# Patient Record
Sex: Male | Born: 2000 | Race: White | Hispanic: No | Marital: Single | Smoking: Never smoker
Health system: Southern US, Community
[De-identification: ages and names within clinical notes are randomized; demographics above are authoritative.]

## PROBLEM LIST (undated history)

## (undated) ENCOUNTER — Ambulatory Visit: Admission: EM | Payer: Self-pay | Source: Home / Self Care

## (undated) DIAGNOSIS — K219 Gastro-esophageal reflux disease without esophagitis: Secondary | ICD-10-CM

## (undated) HISTORY — PX: FINGER SURGERY: SHX640

## (undated) HISTORY — PX: OTHER SURGICAL HISTORY: SHX169

---
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
## 2019-04-16 ENCOUNTER — Other Ambulatory Visit: Payer: Self-pay

## 2019-04-16 ENCOUNTER — Other Ambulatory Visit: Payer: Self-pay | Admitting: Otolaryngology

## 2019-04-16 ENCOUNTER — Encounter (HOSPITAL_BASED_OUTPATIENT_CLINIC_OR_DEPARTMENT_OTHER): Payer: Self-pay | Admitting: *Deleted

## 2019-04-20 ENCOUNTER — Other Ambulatory Visit (HOSPITAL_COMMUNITY)
Admission: RE | Admit: 2019-04-20 | Discharge: 2019-04-20 | Disposition: A | Discharge status: Home / Self Care | Payer: Medicaid Other | Source: Ambulatory Visit | Attending: Otolaryngology | Admitting: Otolaryngology

## 2019-04-20 DIAGNOSIS — Z1159 Encounter for screening for other viral diseases: Secondary | ICD-10-CM | POA: Diagnosis not present

## 2019-04-20 DIAGNOSIS — Z01812 Encounter for preprocedural laboratory examination: Secondary | ICD-10-CM | POA: Insufficient documentation

## 2019-04-21 LAB — NOVEL CORONAVIRUS, NAA (HOSP ORDER, SEND-OUT TO REF LAB; TAT 18-24 HRS): SARS-CoV-2, NAA: NOT DETECTED

## 2019-04-23 ENCOUNTER — Ambulatory Visit (HOSPITAL_BASED_OUTPATIENT_CLINIC_OR_DEPARTMENT_OTHER): Payer: Medicaid Other | Admitting: Anesthesiology

## 2019-04-23 ENCOUNTER — Encounter (HOSPITAL_BASED_OUTPATIENT_CLINIC_OR_DEPARTMENT_OTHER): Payer: Self-pay | Admitting: *Deleted

## 2019-04-23 ENCOUNTER — Ambulatory Visit (HOSPITAL_BASED_OUTPATIENT_CLINIC_OR_DEPARTMENT_OTHER)
Admission: RE | Admit: 2019-04-23 | Discharge: 2019-04-23 | Disposition: A | Discharge status: Home / Self Care | Payer: Medicaid Other | Attending: Otolaryngology | Admitting: Otolaryngology

## 2019-04-23 ENCOUNTER — Encounter (HOSPITAL_BASED_OUTPATIENT_CLINIC_OR_DEPARTMENT_OTHER)
Admission: RE | Disposition: A | Discharge status: Home / Self Care | Payer: Self-pay | Source: Home / Self Care | Attending: Otolaryngology

## 2019-04-23 ENCOUNTER — Other Ambulatory Visit: Payer: Self-pay

## 2019-04-23 DIAGNOSIS — J312 Chronic pharyngitis: Secondary | ICD-10-CM | POA: Diagnosis not present

## 2019-04-23 DIAGNOSIS — J353 Hypertrophy of tonsils with hypertrophy of adenoids: Secondary | ICD-10-CM | POA: Diagnosis not present

## 2019-04-23 DIAGNOSIS — G4733 Obstructive sleep apnea (adult) (pediatric): Secondary | ICD-10-CM | POA: Diagnosis not present

## 2019-04-23 DIAGNOSIS — J3503 Chronic tonsillitis and adenoiditis: Secondary | ICD-10-CM | POA: Diagnosis not present

## 2019-04-23 DIAGNOSIS — K219 Gastro-esophageal reflux disease without esophagitis: Secondary | ICD-10-CM | POA: Insufficient documentation

## 2019-04-23 DIAGNOSIS — J3501 Chronic tonsillitis: Secondary | ICD-10-CM | POA: Insufficient documentation

## 2019-04-23 HISTORY — PX: TONSILLECTOMY AND ADENOIDECTOMY: SHX28

## 2019-04-23 HISTORY — DX: Gastro-esophageal reflux disease without esophagitis: K21.9

## 2019-04-23 SURGERY — TONSILLECTOMY AND ADENOIDECTOMY
Anesthesia: General | Site: Throat | Laterality: Bilateral

## 2019-04-23 MED ORDER — MEPERIDINE HCL 25 MG/ML IJ SOLN: 6.2500 mg | INTRAMUSCULAR | Status: DC | PRN

## 2019-04-23 MED ORDER — LIDOCAINE 2% (20 MG/ML) 5 ML SYRINGE
INTRAMUSCULAR | Status: DC | PRN
  Administered 2019-04-23: 80 mg via INTRAVENOUS

## 2019-04-23 MED ORDER — SUCCINYLCHOLINE CHLORIDE 20 MG/ML IJ SOLN
INTRAMUSCULAR | Status: DC | PRN
  Administered 2019-04-23: 100 mg via INTRAVENOUS

## 2019-04-23 MED ORDER — DEXMEDETOMIDINE HCL IN NACL 400 MCG/100ML IV SOLN
INTRAVENOUS | Status: DC | PRN
  Administered 2019-04-23: 20 ug via INTRAVENOUS

## 2019-04-23 MED ORDER — SCOPOLAMINE 1 MG/3DAYS TD PT72: 1.0000 | MEDICATED_PATCH | Freq: Once | TRANSDERMAL | Status: DC | PRN

## 2019-04-23 MED ORDER — SCOPOLAMINE 1 MG/3DAYS TD PT72: 1.0000 | MEDICATED_PATCH | Freq: Once | TRANSDERMAL | Status: DC

## 2019-04-23 MED ORDER — AMOXICILLIN 400 MG/5ML PO SUSR: 800.0000 mg | Freq: Two times a day (BID) | ORAL | 0 refills | Status: AC

## 2019-04-23 MED ORDER — FENTANYL CITRATE (PF) 100 MCG/2ML IJ SOLN
INTRAMUSCULAR | Status: AC
  Filled 2019-04-23: qty 2

## 2019-04-23 MED ORDER — LACTATED RINGERS IV SOLN
INTRAVENOUS | Status: DC
  Administered 2019-04-23 (×2): via INTRAVENOUS

## 2019-04-23 MED ORDER — OXYCODONE-ACETAMINOPHEN 5-325 MG PO TABS: 1.0000 | ORAL_TABLET | ORAL | 0 refills | Status: AC | PRN

## 2019-04-23 MED ORDER — MIDAZOLAM HCL 2 MG/2ML IJ SOLN
INTRAMUSCULAR | Status: AC
  Filled 2019-04-23: qty 2

## 2019-04-23 MED ORDER — MIDAZOLAM HCL 2 MG/2ML IJ SOLN: 0.5000 mg | Freq: Once | INTRAMUSCULAR | Status: DC | PRN

## 2019-04-23 MED ORDER — HYDROMORPHONE HCL 1 MG/ML IJ SOLN
0.2500 mg | INTRAMUSCULAR | Status: DC | PRN
  Administered 2019-04-23: 0.5 mg via INTRAVENOUS

## 2019-04-23 MED ORDER — FENTANYL CITRATE (PF) 100 MCG/2ML IJ SOLN
50.0000 ug | INTRAMUSCULAR | Status: DC | PRN
  Administered 2019-04-23 (×2): 50 ug via INTRAVENOUS

## 2019-04-23 MED ORDER — HYDROMORPHONE HCL 1 MG/ML IJ SOLN
INTRAMUSCULAR | Status: AC
  Filled 2019-04-23: qty 0.5

## 2019-04-23 MED ORDER — PROPOFOL 10 MG/ML IV BOLUS
INTRAVENOUS | Status: DC | PRN
  Administered 2019-04-23: 200 mg via INTRAVENOUS

## 2019-04-23 MED ORDER — DEXAMETHASONE SODIUM PHOSPHATE 4 MG/ML IJ SOLN
INTRAMUSCULAR | Status: DC | PRN
  Administered 2019-04-23: 10 mg via INTRAVENOUS

## 2019-04-23 MED ORDER — MIDAZOLAM HCL 2 MG/2ML IJ SOLN
1.0000 mg | INTRAMUSCULAR | Status: DC | PRN
  Administered 2019-04-23: 2 mg via INTRAVENOUS

## 2019-04-23 MED ORDER — ONDANSETRON HCL 4 MG/2ML IJ SOLN
INTRAMUSCULAR | Status: DC | PRN
  Administered 2019-04-23: 4 mg via INTRAVENOUS

## 2019-04-23 MED ORDER — OXYMETAZOLINE HCL 0.05 % NA SOLN
NASAL | Status: DC | PRN
  Administered 2019-04-23: 1 via TOPICAL

## 2019-04-23 MED ORDER — SODIUM CHLORIDE 0.9 % IR SOLN
Status: DC | PRN
  Administered 2019-04-23: 500 mL

## 2019-04-23 MED ORDER — PROMETHAZINE HCL 25 MG/ML IJ SOLN: 6.2500 mg | INTRAMUSCULAR | Status: DC | PRN

## 2019-04-23 SURGICAL SUPPLY — 31 items
BNDG COHESIVE 2X5 TAN STRL LF (GAUZE/BANDAGES/DRESSINGS) IMPLANT
CANISTER SUCT 1200ML W/VALVE (MISCELLANEOUS) ×3 IMPLANT
CATH ROBINSON RED A/P 10FR (CATHETERS) IMPLANT
CATH ROBINSON RED A/P 14FR (CATHETERS) ×3 IMPLANT
COAGULATOR SUCT 6 FR SWTCH (ELECTROSURGICAL) ×1
COAGULATOR SUCT SWTCH 10FR 6 (ELECTROSURGICAL) ×2 IMPLANT
COVER BACK TABLE REUSABLE LG (DRAPES) ×3 IMPLANT
COVER MAYO STAND REUSABLE (DRAPES) ×3 IMPLANT
COVER WAND RF STERILE (DRAPES) IMPLANT
ELECT REM PT RETURN 9FT ADLT (ELECTROSURGICAL) ×3
ELECT REM PT RETURN 9FT PED (ELECTROSURGICAL)
ELECTRODE REM PT RETRN 9FT PED (ELECTROSURGICAL) IMPLANT
ELECTRODE REM PT RTRN 9FT ADLT (ELECTROSURGICAL) ×1 IMPLANT
GAUZE SPONGE 4X4 12PLY STRL LF (GAUZE/BANDAGES/DRESSINGS) ×3 IMPLANT
GLOVE BIO SURGEON STRL SZ7.5 (GLOVE) ×3 IMPLANT
GOWN STRL REUS W/ TWL LRG LVL3 (GOWN DISPOSABLE) ×2 IMPLANT
GOWN STRL REUS W/TWL LRG LVL3 (GOWN DISPOSABLE) ×4
IV NS 500ML (IV SOLUTION) ×2
IV NS 500ML BAXH (IV SOLUTION) ×1 IMPLANT
MARKER SKIN DUAL TIP RULER LAB (MISCELLANEOUS) IMPLANT
NS IRRIG 1000ML POUR BTL (IV SOLUTION) ×3 IMPLANT
SHEET MEDIUM DRAPE 40X70 STRL (DRAPES) ×3 IMPLANT
SOLUTION BUTLER CLEAR DIP (MISCELLANEOUS) ×3 IMPLANT
SPONGE TONSIL TAPE 1.25 RFD (DISPOSABLE) ×3 IMPLANT
SYR BULB 3OZ (MISCELLANEOUS) IMPLANT
TOWEL GREEN STERILE FF (TOWEL DISPOSABLE) ×3 IMPLANT
TUBE CONNECTING 20'X1/4 (TUBING) ×1
TUBE CONNECTING 20X1/4 (TUBING) ×2 IMPLANT
TUBE SALEM SUMP 12R W/ARV (TUBING) IMPLANT
TUBE SALEM SUMP 16 FR W/ARV (TUBING) ×3 IMPLANT
WAND COBLATOR 70 EVAC XTRA (SURGICAL WAND) ×3 IMPLANT

## 2019-04-23 NOTE — H&P (Signed)
Cc: Recurrent throat swelling, sore throat, loud snoring  HPI: The patient is an 18 year old male who presents today complaining of recurrent throat swelling, hoarseness, and sore throat.  According to the patient, he has noted recurrent swelling of his throat over the past year.  One month ago, he had a severe episode, requiring a visit to the [facility]  He was treated with IV and topical antibiotics.  The swelling is mostly at the back of his throat.  He has a history of recurrent strep infections.  He has had 5 episodes over the past year, requiring treatment with multiple antibiotics.  According to his girlfriend, the patient also snores loudly at night.  She has witnessed several apnea episodes.  The patient has no previous history of ENT surgery.  He has occasional gastroesophageal reflux symptoms.  He is not on any medication at this time.  Currently he denies any significant dysphagia, odynophagia, or dyspnea.   The patient's review of systems (constitutional, eyes, ENT, cardiovascular, respiratory, GI, musculoskeletal, skin, neurologic, psychiatric, endocrine, hematologic, allergic) is noted in the ROS questionnaire.  It is reviewed with the patient.   Family health history: No HTN, DM, CAD, hearing loss or bleeding disorder.  Major events: None.  Ongoing medical problems: Chest pain, reflux, headaches.  Social history: The patient is single. He denies the use tobacco, alcohol or illegal drugs.   Exam: General: Appears normal, non-syndromic, in no acute distress. Head: Normocephalic, no evidence injury, no tenderness, facial buttresses intact without stepoff. Face/sinus: No tenderness to palpation and percussion. Facial movement is normal and symmetric. Eyes: PERRL, EOMI. No scleral icterus, conjunctivae clear. Neuro: CN II exam reveals vision grossly intact.  No nystagmus at any point of gaze. Ears: Auricles well formed without lesions.  Ear canals are intact without mass or lesion.  No  erythema or edema is appreciated.  The TMs are intact without fluid. Nose: External evaluation reveals normal support and skin without lesions.  Dorsum is intact.  Anterior rhinoscopy reveals pink mucosa over anterior aspect of inferior turbinates and intact septum.  No purulence noted. Oral:  Oral cavity and oropharynx are intact, symmetric, without erythema or edema.  Mucosa is moist without lesions. 3+ cryptic and erythematous tonsils bilaterally. Neck: Full range of motion without pain.  There is no significant lymphadenopathy.  No masses palpable.  Thyroid bed within normal limits to palpation.  Parotid glands and submandibular glands equal bilaterally without mass.  Trachea is midline. Neuro:  CN 2-12 grossly intact. Gait normal.   Procedure:  Flexible Fiberoptic Laryngoscopy Risks, benefits, and alternatives of flexible endoscopy were explained to the patient.  Specific mention was made of the risk of throat numbness with difficulty swallowing, possible bleeding from the nose and mouth, and pain from the procedure.  The patient gave oral consent to proceed.  The nasal cavities were decongested and anesthetised with a combination of oxymetazoline and 4% lidocaine solution.  The flexible scope was inserted into the right nasal cavity and advanced towards the nasopharynx.  Visualized mucosa over the turbinates and septum were as described above.  The nasopharynx has significant adenoid hypertrophy. Oropharyngeal walls were symmetric and mobile without lesion, mass, or edema.  Hypopharynx was also without lesion or edema.  Larynx was mobile without lesions. Supraglottic structures were free of edema, mass, and asymmetry.  True vocal folds were white without mass or lesion.  Base of tongue was within normal limits.   Assessment 1.  The patient's history is consistent with  chronic tonsillitis/pharyngitis and obstructive sleep disorder, secondary to severe adenotonsillar hypertrophy.  3+ tonsils are noted  bilaterally.  Severe adenoid hypertrophy is also noted on today's endoscopy examination.  2.  The patient otherwise has a normal laryngoscopy examination.  His larynx and his glottic opening are widely patent.   Plan  1.  The physical exam and endoscopy findings are reviewed with the patient.  2.  Based on the above findings, the patient is a candidate to undergo the adenotonsillectomy procedure.  The risks, benefits, alternatives and details of the procedure are reviewed with the patient.  Questions are invited and answered.  3.  The patient would like to proceed with the procedure.

## 2019-04-23 NOTE — Op Note (Signed)
DATE OF PROCEDURE:  04/23/2019                              OPERATIVE REPORT  SURGEON:  Leta Baptist, MD  PREOPERATIVE DIAGNOSES: 1. Adenotonsillar hypertrophy. 2. Chronic tonsillitis and pharyngitis  POSTOPERATIVE DIAGNOSES: 1. Adenotonsillar hypertrophy. 2. Chronic tonsillitis and pharyngitis  PROCEDURE PERFORMED:  Adenotonsillectomy.  ANESTHESIA:  General endotracheal tube anesthesia.  COMPLICATIONS:  None.  ESTIMATED BLOOD LOSS:  Minimal.  INDICATION FOR PROCEDURE:  Billy Conway Patricia Nettle is a 18 y.o. male with a history of chronic tonsillitis/pharyngitis and obstructive sleep disorder symptoms.  According to the patient, he has been experiencing chronic throat discomfort with loud snoring for several years. The patient continues to be symptomatic despite medical treatments. On examination, the patient was noted to have bilateral 3+ cryptic tonsils, with numerous tonsilloliths. Based on the above findings, the decision was made for the patient to undergo the adenotonsillectomy procedure. Likelihood of success in reducing symptoms was also discussed.  The risks, benefits, alternatives, and details of the procedure were discussed with the patient.  Questions were invited and answered.  Informed consent was obtained.  DESCRIPTION:  The patient was taken to the operating room and placed supine on the operating table.  General endotracheal tube anesthesia was administered by the anesthesiologist.  The patient was positioned and prepped and draped in a standard fashion for adenotonsillectomy.  A Crowe-Davis mouth gag was inserted into the oral cavity for exposure. 3+ cryptic tonsils were noted bilaterally.  No bifidity was noted.  Indirect mirror examination of the nasopharynx revealed mild adenoid hypertrophy. The adenoid was ablated with the Coblator device. Hemostasis was achieved with the Coblator device.  The right tonsil was then grasped with a straight Allis clamp and retracted medially.   It was resected free from the underlying pharyngeal constrictor muscles with the Coblator device.  The same procedure was repeated on the left side without exception.  The surgical sites were copiously irrigated.  The mouth gag was removed.  The care of the patient was turned over to the anesthesiologist.  The patient was awakened from anesthesia without difficulty.  The patient was extubated and transferred to the recovery room in good condition.  OPERATIVE FINDINGS:  Adenotonsillar hypertrophy.  SPECIMEN:  None.  FOLLOWUP CARE:  The patient will be discharged home once awake and alert.  He will be placed on amoxicillin 800 mg p.o. b.i.d. for 5 days, and percocet for postop pain control.   The patient will follow up in my office in approximately 2 weeks.  Dustine Stickler W Sevannah Madia 04/23/2019 10:12 AM

## 2019-04-23 NOTE — Anesthesia Procedure Notes (Signed)
Procedure Name: Intubation Date/Time: 04/23/2019 9:37 AM Performed by: Eulas Post, Bretta Fees W, CRNA Pre-anesthesia Checklist: Patient identified, Emergency Drugs available, Suction available and Patient being monitored Patient Re-evaluated:Patient Re-evaluated prior to induction Oxygen Delivery Method: Circle system utilized Preoxygenation: Pre-oxygenation with 100% oxygen Induction Type: IV induction Ventilation: Mask ventilation without difficulty Laryngoscope Size: Miller and 2 Grade View: Grade I Tube type: Oral Tube size: 7.0 mm Number of attempts: 1 Airway Equipment and Method: Stylet and Oral airway Placement Confirmation: ETT inserted through vocal cords under direct vision,  positive ETCO2 and breath sounds checked- equal and bilateral Secured at: 24 cm Tube secured with: Tape Dental Injury: Teeth and Oropharynx as per pre-operative assessment

## 2019-04-23 NOTE — Discharge Instructions (Signed)
Billy Conway WOOI Olin Gurski M.D[facility] P.A[facility] °Postoperative Instructions for Tonsillectomy & Adenoidectomy (T&A) °Activity °Restrict activity at home for the first two days, resting as much as possible. Light indoor activity is best. You may usually return to school or work within a week but void strenuous activity and sports for two weeks. Sleep with your head elevated on 2-3 pillows for 3-4 days to help decrease swelling. °Diet °Due to tissue swelling and throat discomfort, you may have little desire to drink for several days. However fluids are very important to prevent dehydration. You will find that non-acidic juices, soups, popsicles, Jell-O, custard, puddings, and any soft or mashed foods taken in small quantities can be swallowed fairly easily. Try to increase your fluid and food intake as the discomfort subsides. It is recommended that a child receive 1-1/2 quarts of fluid in a 24-hour period. Adult require twice this amount.  °Discomfort °Your sore throat may be relieved by applying an ice collar to your neck and/or by taking Tylenol®. You may experience an earache, which is due to referred pain from the throat. Referred ear pain is commonly felt at night when trying to rest. ° °Bleeding                        Although rare, there is risk of having some bleeding during the first 2 weeks after having a T&A. This usually happens between days 7-10 postoperatively. If you or your child should have any bleeding, try to remain calm. We recommend sitting up quietly in a chair and gently spitting out the blood into a bowl. For adults, gargling gently with ice water may help. If the bleeding does not stop after a short time (5 minutes), is more than 1 teaspoonful, or if you become worried, please call our office at [phone] or go directly to the nearest hospital emergency room. Do not eat or drink anything prior to going to the hospital as you may need to be taken to the operating room in order to control the bleeding. °GENERAL  CONSIDERATIONS °1. Brush your teeth regularly. Avoid mouthwashes and gargles for three weeks. You may gargle gently with warm salt-water as necessary or spray with Chloraseptic®. You may make salt-water by placing 2 teaspoons of table salt into a quart of fresh water. Warm the salt-water in a microwave to a luke warm temperature.  °2. Avoid exposure to colds and upper respiratory infections if possible.  °3. If you look into a mirror or into your child's mouth, you will see white-gray patches in the back of the throat. This is normal after having a T&A and is like a scab that forms on the skin after an abrasion. It will disappear once the back of the throat heals completely. However, it may cause a noticeable odor; this too will disappear with time. Again, warm salt-water gargles may be used to help keep the throat clean and promote healing.  °4. You may notice a temporary change in voice quality, such as a higher pitched voice or a nasal sound, until healing is complete. This may last for 1-2 weeks and should resolve.  °5. Do not take or give you child any medications that we have not prescribed or recommended.  °6. Snoring may occur, especially at night, for the first week after a T&A. It is due to swelling of the soft palate and will usually resolve.  °Please call our office at [phone] if you have any questions.   °

## 2019-04-23 NOTE — Anesthesia Preprocedure Evaluation (Addendum)
Anesthesia Evaluation  Patient identified by MRN, date of birth, ID band Patient awake    Reviewed: Allergy & Precautions, NPO status , Patient's Chart, lab work & pertinent test results  History of Anesthesia Complications Negative for: history of anesthetic complications  Airway Mallampati: I  TM Distance: >3 FB Neck ROM: Full    Dental  (+) Dental Advisory Given   Pulmonary neg pulmonary ROS,  04/20/2019 novel coronavirus NEG   breath sounds clear to auscultation       Cardiovascular negative cardio ROS   Rhythm:Regular Rate:Normal     Neuro/Psych negative neurological ROS     GI/Hepatic Neg liver ROS, GERD  Controlled,  Endo/Other  negative endocrine ROS  Renal/GU negative Renal ROS     Musculoskeletal   Abdominal   Peds  Hematology negative hematology ROS (+)   Anesthesia Other Findings   Reproductive/Obstetrics                            Anesthesia Physical Anesthesia Plan  ASA: II  Anesthesia Plan: General   Post-op Pain Management:    Induction: Intravenous  PONV Risk Score and Plan: 3 and Scopolamine patch - Pre-op, Ondansetron and Dexamethasone  Airway Management Planned: Oral ETT  Additional Equipment:   Intra-op Plan:   Post-operative Plan: Extubation in OR  Informed Consent: I have reviewed the patients History and Physical, chart, labs and discussed the procedure including the risks, benefits and alternatives for the proposed anesthesia with the patient or authorized representative who has indicated his/her understanding and acceptance.     Dental advisory given  Plan Discussed with: CRNA and Surgeon  Anesthesia Plan Comments:         Anesthesia Quick Evaluation

## 2019-04-23 NOTE — Transfer of Care (Signed)
Immediate Anesthesia Transfer of Care Note  Patient: Billy Conway  Procedure(s) Performed: TONSILLECTOMY AND ADENOIDECTOMY (Bilateral Throat)  Patient Location: PACU  Anesthesia Type:General  Level of Consciousness: drowsy  Airway & Oxygen Therapy: Patient Spontanous Breathing and Patient connected to face mask oxygen  Post-op Assessment: Report given to RN and Post -op Vital signs reviewed and stable  Post vital signs: Reviewed and stable  Last Vitals:  Vitals Value Taken Time  BP 120/90 04/23/19 1015  Temp    Pulse 63 04/23/19 1016  Resp 9 04/23/19 1016  SpO2 100 % 04/23/19 1016  Vitals shown include unvalidated device data.  Last Pain:  Vitals:   04/23/19 0833  TempSrc: Oral  PainSc: 0-No pain      Patients Stated Pain Goal: 0 (34/91/79 1505)  Complications: No apparent anesthesia complications

## 2019-04-23 NOTE — Anesthesia Postprocedure Evaluation (Signed)
Anesthesia Post Note  Patient: Billy Conway  Procedure(s) Performed: TONSILLECTOMY AND ADENOIDECTOMY (Bilateral Throat)     Patient location during evaluation: PACU Anesthesia Type: General Level of consciousness: awake and alert, oriented and patient cooperative Pain management: pain level controlled Vital Signs Assessment: post-procedure vital signs reviewed and stable Respiratory status: spontaneous breathing, nonlabored ventilation and respiratory function stable Cardiovascular status: blood pressure returned to baseline and stable Postop Assessment: no apparent nausea or vomiting Anesthetic complications: no    Last Vitals:  Vitals:   04/23/19 1130 04/23/19 1155  BP: 116/70 121/84  Pulse: (!) 53 62  Resp: 16 18  Temp:  36.4 C  SpO2: 100% 100%    Last Pain:  Vitals:   04/23/19 1155  TempSrc:   PainSc: 4                  Adelee Hannula,E. Demetrius Mahler

## 2019-04-26 ENCOUNTER — Encounter (HOSPITAL_BASED_OUTPATIENT_CLINIC_OR_DEPARTMENT_OTHER): Payer: Self-pay | Admitting: Otolaryngology

## 2020-07-12 ENCOUNTER — Ambulatory Visit
Admission: EM | Admit: 2020-07-12 | Discharge: 2020-07-12 | Disposition: A | Discharge status: Home / Self Care | Payer: Medicaid Other | Attending: Emergency Medicine | Admitting: Emergency Medicine

## 2020-07-12 ENCOUNTER — Other Ambulatory Visit: Payer: Self-pay

## 2020-07-12 DIAGNOSIS — Z1152 Encounter for screening for COVID-19: Secondary | ICD-10-CM | POA: Diagnosis not present

## 2020-07-12 DIAGNOSIS — R0981 Nasal congestion: Secondary | ICD-10-CM | POA: Diagnosis not present

## 2020-07-12 DIAGNOSIS — J029 Acute pharyngitis, unspecified: Secondary | ICD-10-CM | POA: Diagnosis not present

## 2020-07-12 DIAGNOSIS — Z23 Encounter for immunization: Secondary | ICD-10-CM

## 2020-07-12 MED ORDER — FLUTICASONE PROPIONATE 50 MCG/ACT NA SUSP: 1.0000 | Freq: Every day | NASAL | 0 refills | Status: DC

## 2020-07-12 MED ORDER — CETIRIZINE HCL 10 MG PO TABS: 10.0000 mg | ORAL_TABLET | Freq: Every day | ORAL | 0 refills | Status: DC

## 2020-07-12 NOTE — ED Provider Notes (Signed)
[facility]    CSN: [id number] Arrival date & time: 07/12/20  1918      History   Chief Complaint Chief Complaint  Patient presents with  . Nasal Congestion    HPI Billy Conway is a 19 y.o. male  Presenting for 5-day course of nasal congestion, scratchy throat.  No known sick contacts, fever, chest pain, difficulty breathing.  Denying cough.  Patient did recently return to school, does work.  Has not tried thing for symptoms.  Past Medical History:  Diagnosis Date  . GERD (gastroesophageal reflux disease)     There are no problems to display for this patient.   Past Surgical History:  Procedure Laterality Date  . FINGER SURGERY    . TONSILLECTOMY AND ADENOIDECTOMY Bilateral 04/23/2019   Procedure: TONSILLECTOMY AND ADENOIDECTOMY;  Surgeon: Newman Pies, MD;  Location: [facility];  Service: ENT;  Laterality: Bilateral;  . Wisdom Teeth Extractions         Home Medications    Prior to Admission medications   Medication Sig Start Date End Date Taking? Authorizing Provider  acetaminophen (TYLENOL) 500 MG tablet Take 500 mg by mouth every 6 (six) hours as needed (pain/swelling).    [provider]  cetirizine (ZYRTEC ALLERGY) 10 MG tablet Take 1 tablet (10 mg total) by mouth daily. 07/12/20   Hall-Potvin, [place], PA-C  fluticasone (FLONASE) 50 MCG/ACT nasal spray Place 1 spray into both nostrils daily. 07/12/20   Hall-Potvin, [place], PA-C  ibuprofen (ADVIL,MOTRIN) 600 MG tablet Take 1 tablet (600 mg total) by mouth every 6 (six) hours as needed for fever, mild pain or moderate pain. 06/23/16   Lowanda Foster, NP    Family History History reviewed. No pertinent family history.  Social History Social History   Tobacco Use  . Smoking status: Never Smoker  . Smokeless tobacco: Never Used  Substance Use Topics  . Alcohol use: Not Currently  . Drug use: Not Currently    Types: Marijuana    Comment: marijuana - last smoked  6 months ago     Allergies   Patient has no known allergies.   Review of Systems As per HPI   Physical Exam Triage Vital Signs ED Triage Vitals  Enc Vitals Group     BP      Pulse      Resp      Temp      Temp src      SpO2      Weight      Height      Head Circumference      Peak Flow      Pain Score      Pain Loc      Pain Edu?      Excl. in GC?    No data found.  Updated Vital Signs BP 133/73 (BP Location: Left Arm)   Pulse 66   Temp (!) 97.3 F (36.3 C) (Tympanic)   Resp 17   Wt 168 lb 2 oz (76.3 kg)   SpO2 99%   BMI 21.59 kg/m   Visual Acuity Right Eye Distance:   Left Eye Distance:   Bilateral Distance:    Right Eye Near:   Left Eye Near:    Bilateral Near:     Physical Exam Constitutional:      General: He is not in acute distress.    Appearance: He is not toxic-appearing or diaphoretic.  HENT:     Head: Normocephalic  and atraumatic.     Mouth/Throat:     Mouth: Mucous membranes are moist.     Pharynx: Oropharynx is clear.  Eyes:     General: No scleral icterus.    Conjunctiva/sclera: Conjunctivae normal.     Pupils: Pupils are equal, round, and reactive to light.  Neck:     Comments: Trachea midline, negative JVD Cardiovascular:     Rate and Rhythm: Normal rate and regular rhythm.  Pulmonary:     Effort: Pulmonary effort is normal. No respiratory distress.     Breath sounds: No wheezing.  Musculoskeletal:     Cervical back: Neck supple. No tenderness.  Lymphadenopathy:     Cervical: No cervical adenopathy.  Skin:    Capillary Refill: Capillary refill takes less than 2 seconds.     Coloration: Skin is not jaundiced or pale.     Findings: No rash.  Neurological:     Mental Status: He is alert and oriented to person, place, and time.      UC Treatments / Results  Labs (all labs ordered are listed, but only abnormal results are displayed) Labs Reviewed  NOVEL CORONAVIRUS, NAA    EKG   Radiology No results  found.  Procedures Procedures (including critical care time)  Medications Ordered in UC Medications - No data to display  Initial Impression / Assessment and Plan / UC Course  I have reviewed the triage vital signs and the nursing notes.  Pertinent labs & imaging results that were available during my care of the patient were reviewed by me and considered in my medical decision making (see chart for details).     Patient afebrile, nontoxic, with SpO2 99%.  Centor criteria does not qualify patient for strep testing at this time.  Covid PCR pending.  Patient to quarantine until results are back.  We will treat supportively as outlined below.  Return precautions discussed, patient verbalized understanding and is agreeable to plan. Final Clinical Impressions(s) / UC Diagnoses   Final diagnoses:  Encounter for screening for COVID-19  Nasal congestion  Sore throat     Discharge Instructions     Your COVID test is pending - it is important to quarantine / isolate at home until your results are back. If you test positive and would like further evaluation for persistent or worsening symptoms, you may schedule an E-visit or virtual (video) visit throughout the [facility] app or website.  PLEASE NOTE: If you develop severe chest pain or shortness of breath please go to the ER or call 9-1-1 for further evaluation --> DO NOT schedule electronic or virtual visits for this. Please call our office for further guidance / recommendations as needed.  For information about the Covid vaccine, please visit [email or web address]    ED Prescriptions    Medication Sig Dispense Auth. Provider   cetirizine (ZYRTEC ALLERGY) 10 MG tablet Take 1 tablet (10 mg total) by mouth daily. 30 tablet Hall-Potvin, [place], PA-C   fluticasone (FLONASE) 50 MCG/ACT nasal spray Place 1 spray into both nostrils daily. 16 g Hall-Potvin, [place], PA-C     PDMP not reviewed this encounter.   Hall-Potvin,  [place], [place] 07/12/20 2022

## 2020-07-12 NOTE — Discharge Instructions (Addendum)
Your COVID test is pending - it is important to quarantine / isolate at home until your results are back. °If you test positive and would like further evaluation for persistent or worsening symptoms, you may schedule an E-visit or virtual (video) visit throughout the [facility] MyChart app or website. ° °PLEASE NOTE: If you develop severe chest pain or shortness of breath please go to the ER or call 9-1-1 for further evaluation --> DO NOT schedule electronic or virtual visits for this. °Please call our office for further guidance / recommendations as needed. ° °For information about the Covid vaccine, please visit [email or web address] °

## 2020-07-12 NOTE — ED Triage Notes (Signed)
Pt c/o nasal congestion with a dry throat x 4-5 days.

## 2020-07-14 ENCOUNTER — Telehealth: Payer: Self-pay | Admitting: Physician Assistant

## 2020-07-14 NOTE — Telephone Encounter (Signed)
Mother states insurance would not cover flonase unless Rx written for <34 doses. Attempted to adjust to <34 doses, but unable to do so. Discussed medicine is over the counter, mother states will pick up over the counter to use.

## 2020-07-15 LAB — NOVEL CORONAVIRUS, NAA: SARS-CoV-2, NAA: NOT DETECTED

## 2020-09-12 ENCOUNTER — Encounter: Payer: Self-pay | Admitting: Emergency Medicine

## 2020-09-12 ENCOUNTER — Ambulatory Visit
Admission: EM | Admit: 2020-09-12 | Discharge: 2020-09-12 | Disposition: A | Discharge status: Home / Self Care | Payer: Medicaid Other | Attending: Emergency Medicine | Admitting: Emergency Medicine

## 2020-09-12 DIAGNOSIS — J069 Acute upper respiratory infection, unspecified: Secondary | ICD-10-CM | POA: Diagnosis not present

## 2020-09-12 DIAGNOSIS — Z1152 Encounter for screening for COVID-19: Secondary | ICD-10-CM

## 2020-09-12 MED ORDER — ACETAMINOPHEN 325 MG PO TABS
650.0000 mg | ORAL_TABLET | Freq: Once | ORAL | Status: AC
  Administered 2020-09-12: 650 mg via ORAL

## 2020-09-12 NOTE — ED Triage Notes (Signed)
Pt presents with headache, cough, and body aches xs 2 days.

## 2020-09-12 NOTE — ED Provider Notes (Signed)
[facility]    CSN: [id number] Arrival date & time: 09/12/20  1917      History   Chief Complaint Chief Complaint  Patient presents with  . Headache  . Cough  . Generalized Body Aches    HPI Billy Conway Valarie Merino is a 19 y.o. male  Presenting for Covid testing: Exposure: girlfriend's niece Date of exposure: 10/31 Any fever, symptoms since exposure: Yes-mild, intermittent headache, nonproductive cough, myalgias x2 days.  No chest pain, shortness of breath, fever.  Has not taken anything for symptoms.  Past Medical History:  Diagnosis Date  . GERD (gastroesophageal reflux disease)     There are no problems to display for this patient.   Past Surgical History:  Procedure Laterality Date  . FINGER SURGERY    . TONSILLECTOMY AND ADENOIDECTOMY Bilateral 04/23/2019   Procedure: TONSILLECTOMY AND ADENOIDECTOMY;  Surgeon: Newman Pies, MD;  Location: [facility];  Service: ENT;  Laterality: Bilateral;  . Wisdom Teeth Extractions         Home Medications    Prior to Admission medications   Medication Sig Start Date End Date Taking? Authorizing Provider  acetaminophen (TYLENOL) 500 MG tablet Take 500 mg by mouth every 6 (six) hours as needed (pain/swelling).    [provider]  cetirizine (ZYRTEC ALLERGY) 10 MG tablet Take 1 tablet (10 mg total) by mouth daily. 07/12/20   Hall-Potvin, [place], PA-C  fluticasone (FLONASE) 50 MCG/ACT nasal spray Place 1 spray into both nostrils daily. 07/12/20   Hall-Potvin, [place], PA-C  ibuprofen (ADVIL,MOTRIN) 600 MG tablet Take 1 tablet (600 mg total) by mouth every 6 (six) hours as needed for fever, mild pain or moderate pain. 06/23/16   Lowanda Foster, NP    Family History History reviewed. No pertinent family history.  Social History Social History   Tobacco Use  . Smoking status: Never Smoker  . Smokeless tobacco: Never Used  Substance Use Topics  . Alcohol use: Not Currently  . Drug use:  Not Currently    Types: Marijuana    Comment: marijuana - last smoked 6 months ago     Allergies   Patient has no known allergies.   Review of Systems Review of Systems  Constitutional: Negative for fatigue and fever.  Respiratory: Positive for cough. Negative for shortness of breath.   Cardiovascular: Negative for chest pain and palpitations.  Gastrointestinal: Negative for abdominal pain, diarrhea and vomiting.  Musculoskeletal: Positive for myalgias. Negative for arthralgias.  Skin: Negative for rash and wound.  Neurological: Positive for headaches. Negative for speech difficulty.  All other systems reviewed and are negative.    Physical Exam Triage Vital Signs ED Triage Vitals  Enc Vitals Group     BP 09/12/20 1927 (!) 141/82     Pulse Rate 09/12/20 1927 84     Resp 09/12/20 1927 17     Temp 09/12/20 1927 (!) 100.9 F (38.3 C)     Temp Source 09/12/20 1927 Oral     SpO2 09/12/20 1927 98 %     Weight --      Height --      Head Circumference --      Peak Flow --      Pain Score 09/12/20 1926 6     Pain Loc --      Pain Edu? --      Excl. in GC? --    No data found.  Updated Vital Signs BP (!) 141/82 (BP Location: Left  Arm)   Pulse 84   Temp (!) 100.9 F (38.3 C) (Oral)   Resp 17   SpO2 98%   Visual Acuity Right Eye Distance:   Left Eye Distance:   Bilateral Distance:    Right Eye Near:   Left Eye Near:    Bilateral Near:     Physical Exam Constitutional:      General: He is not in acute distress.    Appearance: He is not toxic-appearing or diaphoretic.  HENT:     Head: Normocephalic and atraumatic.     Mouth/Throat:     Mouth: Mucous membranes are moist.     Pharynx: Oropharynx is clear.  Eyes:     General: No scleral icterus.    Conjunctiva/sclera: Conjunctivae normal.     Pupils: Pupils are equal, round, and reactive to light.  Neck:     Comments: Trachea midline, negative JVD Cardiovascular:     Rate and Rhythm: Normal rate and  regular rhythm.  Pulmonary:     Effort: Pulmonary effort is normal. No respiratory distress.     Breath sounds: No wheezing.  Musculoskeletal:     Cervical back: Neck supple. No tenderness.  Lymphadenopathy:     Cervical: No cervical adenopathy.  Skin:    Capillary Refill: Capillary refill takes less than 2 seconds.     Coloration: Skin is not jaundiced or pale.     Findings: No rash.  Neurological:     Mental Status: He is alert and oriented to person, place, and time.      UC Treatments / Results  Labs (all labs ordered are listed, but only abnormal results are displayed) Labs Reviewed  NOVEL CORONAVIRUS, NAA    EKG   Radiology No results found.  Procedures Procedures (including critical care time)  Medications Ordered in UC Medications  acetaminophen (TYLENOL) tablet 650 mg (650 mg Oral Given 09/12/20 1935)    Initial Impression / Assessment and Plan / UC Course  I have reviewed the triage vital signs and the nursing notes.  Pertinent labs & imaging results that were available during my care of the patient were reviewed by me and considered in my medical decision making (see chart for details).     Patient febrile, nontoxic, with SpO2 98%.  Covid PCR pending.  Patient to quarantine until results are back.  We will treat supportively as outlined below.  Return precautions discussed, patient verbalized understanding and is agreeable to plan. Final Clinical Impressions(s) / UC Diagnoses   Final diagnoses:  Encounter for screening for COVID-19  URI with cough and congestion     Discharge Instructions     Zyrtec, Start flonase, atrovent nasal spray for nasal congestion/drainage. You can use over the counter nasal saline rinse such as neti pot for nasal congestion. Keep hydrated, your urine should be clear to pale yellow in color. Tylenol/motrin for fever and pain. Monitor for any worsening of symptoms, chest pain, shortness of breath, wheezing, swelling of the  throat, go to the emergency department for further evaluation needed.     ED Prescriptions    None     PDMP not reviewed this encounter.   Hall-Potvin, [place], [place] 09/12/20 2017

## 2020-09-12 NOTE — Discharge Instructions (Signed)
Zyrtec, Start flonase, atrovent nasal spray for nasal congestion/drainage. You can use over the counter nasal saline rinse such as neti pot for nasal congestion. Keep hydrated, your urine should be clear to pale yellow in color. Tylenol/motrin for fever and pain. Monitor for any worsening of symptoms, chest pain, shortness of breath, wheezing, swelling of the throat, go to the emergency department for further evaluation needed.

## 2020-09-14 ENCOUNTER — Telehealth: Payer: Self-pay | Admitting: Emergency Medicine

## 2020-09-14 LAB — NOVEL CORONAVIRUS, NAA

## 2020-09-14 NOTE — Telephone Encounter (Signed)
Received call from [facility] that this patient's COVID sample did not have a label on it and it will need to be recollected.  Attempted to reach patient x 1, LVM.  Updated staff at facility in case patient returns today.

## 2021-04-11 ENCOUNTER — Ambulatory Visit: Admission: EM | Admit: 2021-04-11 | Discharge: 2021-04-11 | Discharge status: Against Medical Advice | Payer: 59

## 2021-04-11 ENCOUNTER — Other Ambulatory Visit: Payer: Self-pay

## 2021-04-12 ENCOUNTER — Other Ambulatory Visit: Payer: Self-pay

## 2021-04-12 ENCOUNTER — Ambulatory Visit
Admission: RE | Admit: 2021-04-12 | Discharge: 2021-04-12 | Disposition: A | Discharge status: Home / Self Care | Payer: Self-pay | Source: Ambulatory Visit

## 2021-04-12 ENCOUNTER — Ambulatory Visit
Admission: EM | Admit: 2021-04-12 | Discharge: 2021-04-12 | Disposition: A | Discharge status: Home / Self Care | Payer: Medicaid Other | Attending: Emergency Medicine | Admitting: Emergency Medicine

## 2021-04-12 ENCOUNTER — Encounter: Payer: Self-pay | Admitting: Emergency Medicine

## 2021-04-12 DIAGNOSIS — H5789 Other specified disorders of eye and adnexa: Secondary | ICD-10-CM

## 2021-04-12 MED ORDER — POLYMYXIN B-TRIMETHOPRIM 10000-0.1 UNIT/ML-% OP SOLN: 1.0000 [drp] | Freq: Four times a day (QID) | OPHTHALMIC | 0 refills | Status: AC

## 2021-04-12 MED ORDER — FLUTICASONE PROPIONATE 50 MCG/ACT NA SUSP: 1.0000 | Freq: Every day | NASAL | 0 refills | Status: DC

## 2021-04-12 NOTE — ED Provider Notes (Signed)
[facility]    CSN: [id number] Arrival date & time: 04/12/21  1208      History   Chief Complaint Chief Complaint  Patient presents with  . Eye Problem    HPI Billy Conway Billy Conway is a 20 y.o. male history of GERD presenting today for evaluation of left eye redness.  Reports over the past day or 2 he has noticed increased redness to his left eye.  Denies any significant pain or itching.  Mainly noticed redness when looking in the mirror.  Felt slight irritation that he thought may be a stye.  Denies any drainage.  Denies change in vision.  Denies use of contacts.  Has had some mild associated congestion over the past week.  HPI  Past Medical History:  Diagnosis Date  . GERD (gastroesophageal reflux disease)     There are no problems to display for this patient.   Past Surgical History:  Procedure Laterality Date  . FINGER SURGERY    . TONSILLECTOMY AND ADENOIDECTOMY Bilateral 04/23/2019   Procedure: TONSILLECTOMY AND ADENOIDECTOMY;  Surgeon: Newman Pies, MD;  Location: [facility];  Service: ENT;  Laterality: Bilateral;  . Wisdom Teeth Extractions         Home Medications    Prior to Admission medications   Medication Sig Start Date End Date Taking? Authorizing Provider  fluticasone (FLONASE) 50 MCG/ACT nasal spray Place 1-2 sprays into both nostrils daily. 04/12/21  Yes Teola Felipe C, PA-C  trimethoprim-polymyxin b (POLYTRIM) ophthalmic solution Place 1 drop into the left eye every 6 (six) hours for 7 days. 04/12/21 04/19/21 Yes Kian Ottaviano C, PA-C  cetirizine (ZYRTEC ALLERGY) 10 MG tablet Take 1 tablet (10 mg total) by mouth daily. 07/12/20 04/12/21  Hall-Potvin, [place], PA-C    Family History No family history on file.  Social History Social History   Tobacco Use  . Smoking status: Never Smoker  . Smokeless tobacco: Never Used  Substance Use Topics  . Alcohol use: Not Currently  . Drug use: Not Currently    Types: Marijuana     Comment: marijuana - last smoked 6 months ago     Allergies   Patient has no known allergies.   Review of Systems Review of Systems  Constitutional: Negative for activity change, appetite change, chills, fatigue and fever.  HENT: Positive for congestion and rhinorrhea. Negative for ear pain, sinus pressure, sore throat and trouble swallowing.   Eyes: Positive for redness. Negative for photophobia, pain, discharge and visual disturbance.  Respiratory: Negative for cough, chest tightness and shortness of breath.   Cardiovascular: Negative for chest pain.  Gastrointestinal: Negative for abdominal pain, diarrhea, nausea and vomiting.  Musculoskeletal: Negative for myalgias.  Skin: Negative for rash.  Neurological: Negative for dizziness, light-headedness and headaches.     Physical Exam Triage Vital Signs ED Triage Vitals  Enc Vitals Group     BP 04/12/21 1218 (!) 156/89     Pulse Rate 04/12/21 1218 78     Resp --      Temp 04/12/21 1218 98.1 F (36.7 C)     Temp Source 04/12/21 1218 Oral     SpO2 04/12/21 1218 97 %     Weight --      Height --      Head Circumference --      Peak Flow --      Pain Score 04/12/21 1219 0     Pain Loc --      Pain  Edu? --      Excl. in GC? --    No data found.  Updated Vital Signs BP (!) 156/89 (BP Location: Left Arm)   Pulse 78   Temp 98.1 F (36.7 C) (Oral)   SpO2 97%   Visual Acuity Right Eye Distance: 20 15 Left Eye Distance: 20 25 Bilateral Distance: 20 15  Right Eye Near:   Left Eye Near:    Bilateral Near:     Physical Exam Vitals and nursing note reviewed.  Constitutional:      Appearance: He is well-developed.     Comments: No acute distress  HENT:     Head: Normocephalic and atraumatic.     Ears:     Comments: Bilateral ears without tenderness to palpation of external auricle, tragus and mastoid, EAC's without erythema or swelling, TM's with good bony landmarks and cone of light. Non erythematous.      Nose: Nose normal.     Mouth/Throat:     Comments: Oral mucosa pink and moist, no tonsillar enlargement or exudate. Posterior pharynx patent and nonerythematous, no uvula deviation or swelling. Normal phonation. Eyes:     Conjunctiva/sclera: Conjunctivae normal.     Comments: Left eye with increased erythema noted to inferior lateral portion, vasculature appears more injected, no photophobia with exam, anterior chamber clear  Cardiovascular:     Rate and Rhythm: Normal rate.  Pulmonary:     Effort: Pulmonary effort is normal. No respiratory distress.  Abdominal:     General: There is no distension.  Musculoskeletal:        General: Normal range of motion.     Cervical back: Neck supple.  Skin:    General: Skin is warm and dry.  Neurological:     Mental Status: He is alert and oriented to person, place, and time.      UC Treatments / Results  Labs (all labs ordered are listed, but only abnormal results are displayed) Labs Reviewed - No data to display  EKG   Radiology No results found.  Procedures Procedures (including critical care time)  Medications Ordered in UC Medications - No data to display  Initial Impression / Assessment and Plan / UC Course  I have reviewed the triage vital signs and the nursing notes.  Pertinent labs & imaging results that were available during my care of the patient were reviewed by me and considered in my medical decision making (see chart for details).     Left eye redness-subconjunctival hemorrhage versus conjunctivitis-opting to go ahead and cover with Polytrim, but expect symptoms likely should resolve on their own.  No red flags for eye emergency.  Warm compresses and close monitoring.  Adding in Flonase to help with nasal congestion.  Discussed strict return precautions. Patient verbalized understanding and is agreeable with plan.  Final Clinical Impressions(s) / UC Diagnoses   Final diagnoses:  Redness of left eye      Discharge Instructions     Use Polytrim eyedrops 4 times daily x1 week to cover underlying infection This likely will resolve on its own Warm compresses Flonase nasal spray 1 to 2 spray in each nostril daily for nasal congestion Follow-up if not improving or worsening, developing any increased eye pain or vision changes, swelling    ED Prescriptions    Medication Sig Dispense Auth. Provider   trimethoprim-polymyxin b (POLYTRIM) ophthalmic solution Place 1 drop into the left eye every 6 (six) hours for 7 days. 10 mL Audel Coakley, [place] C, PA-C  fluticasone (FLONASE) 50 MCG/ACT nasal spray Place 1-2 sprays into both nostrils daily. 16 g Frankie Scipio, [place] C, PA-C     PDMP not reviewed this encounter.   Lew Dawes, PA-C 04/12/21 1302

## 2021-04-12 NOTE — Discharge Instructions (Signed)
Use Polytrim eyedrops 4 times daily x1 week to cover underlying infection This likely will resolve on its own Warm compresses Flonase nasal spray 1 to 2 spray in each nostril daily for nasal congestion Follow-up if not improving or worsening, developing any increased eye pain or vision changes, swelling

## 2021-04-12 NOTE — ED Triage Notes (Signed)
Patient c/o left eye redness, no pain or itching.  No blurred vision, no drainage.

## 2022-07-11 ENCOUNTER — Ambulatory Visit
Admission: EM | Admit: 2022-07-11 | Discharge: 2022-07-11 | Disposition: A | Discharge status: Home / Self Care | Payer: Medicaid Other | Attending: Physician Assistant | Admitting: Physician Assistant

## 2022-07-11 ENCOUNTER — Other Ambulatory Visit: Payer: Self-pay

## 2022-07-11 ENCOUNTER — Encounter: Payer: Self-pay | Admitting: Emergency Medicine

## 2022-07-11 DIAGNOSIS — J0140 Acute pansinusitis, unspecified: Secondary | ICD-10-CM

## 2022-07-11 MED ORDER — AMOXICILLIN-POT CLAVULANATE 875-125 MG PO TABS: 1.0000 | ORAL_TABLET | Freq: Two times a day (BID) | ORAL | 0 refills | Status: AC

## 2022-07-11 MED ORDER — LEVOCETIRIZINE DIHYDROCHLORIDE 5 MG PO TABS: 5.0000 mg | ORAL_TABLET | Freq: Every evening | ORAL | 0 refills | Status: DC

## 2022-07-11 MED ORDER — FLUTICASONE PROPIONATE 50 MCG/ACT NA SUSP: 1.0000 | Freq: Every day | NASAL | 0 refills | Status: DC

## 2022-07-11 NOTE — ED Provider Notes (Signed)
[facility]    CSN: [id number] Arrival date & time: 07/11/22  1840      History   Chief Complaint Chief Complaint  Patient presents with   Otalgia    HPI Billy Conway is a 21 y.o. male.   Patient presents today with a weeklong history of sinus symptoms as well as bilateral otalgia.  Reports that he initially had congestion, sore throat, body aches, fatigue, malaise.  Denies fever, chest pain, shortness of breath, nausea, vomiting, diarrhea.  His sore throat and other symptoms have improved but he continues to have frontal headache, nasal congestion, bilateral otalgia.  He does report recent airplane travel and swimming but denies any otorrhea, change in his hearing.  Denies any known sick contacts.  He does not have allergies and does not take any over-the-counter allergy medication for symptom management.  Denies any recent antibiotic or steroids.  He reports otalgia is rated 6 on a 0-10 pain scale, localized to both ears, described as pressure, no relieving factors identified.    Past Medical History:  Diagnosis Date   GERD (gastroesophageal reflux disease)     There are no problems to display for this patient.   Past Surgical History:  Procedure Laterality Date   FINGER SURGERY     TONSILLECTOMY AND ADENOIDECTOMY Bilateral 04/23/2019   Procedure: TONSILLECTOMY AND ADENOIDECTOMY;  Surgeon: Newman Pies, MD;  Location: [facility];  Service: ENT;  Laterality: Bilateral;   Wisdom Teeth Extractions         Home Medications    Prior to Admission medications   Medication Sig Start Date End Date Taking? Authorizing Provider  amoxicillin-clavulanate (AUGMENTIN) 875-125 MG tablet Take 1 tablet by mouth every 12 (twelve) hours. 07/11/22  Yes Shontia Gillooly, No[place]to Retort, PA-C  levocetirizine (XYZAL ALLERGY 24HR) 5 MG tablet Take 1 tablet (5 mg total) by mouth every evening. 07/11/22  Yes Donnie Gedeon K, PA-C  fluticasone (FLONASE) 50 MCG/ACT nasal spray  Place 1 spray into both nostrils daily. 07/11/22   Mustafa Potts, Noberto Retort, PA-C  cetirizine (ZYRTEC ALLERGY) 10 MG tablet Take 1 tablet (10 mg total) by mouth daily. 07/12/20 04/12/21  Hall-Potvin, [place], PA-C    Family History History reviewed. No pertinent family history.  Social History Social History   Tobacco Use   Smoking status: Never   Smokeless tobacco: Never  Vaping Use   Vaping Use: Never used  Substance Use Topics   Alcohol use: Not Currently   Drug use: Not Currently    Types: Marijuana    Comment: marijuana - last smoked 6 months ago     Allergies   Patient has no known allergies.   Review of Systems Review of Systems  Constitutional:  Positive for activity change and fatigue. Negative for appetite change and fever.  HENT:  Positive for congestion, ear pain, sinus pressure and sore throat (Improved). Negative for sneezing.   Respiratory:  Positive for cough (Improved). Negative for shortness of breath.   Cardiovascular:  Negative for chest pain.  Gastrointestinal:  Negative for abdominal pain, diarrhea, nausea and vomiting.  Neurological:  Positive for headaches. Negative for dizziness and light-headedness.     Physical Exam Triage Vital Signs ED Triage Vitals  Enc Vitals Group     BP 07/11/22 1856 120/81     Pulse Rate 07/11/22 1856 63     Resp 07/11/22 1856 18     Temp 07/11/22 1856 98 F (36.7 C)     Temp Source 07/11/22  1856 Oral     SpO2 07/11/22 1856 97 %     Weight --      Height --      Head Circumference --      Peak Flow --      Pain Score 07/11/22 1853 6     Pain Loc --      Pain Edu? --      Excl. in GC? --    No data found.  Updated Vital Signs BP 120/81 (BP Location: Left Arm)   Pulse 63   Temp 98 F (36.7 C) (Oral)   Resp 18   SpO2 97%   Visual Acuity Right Eye Distance:   Left Eye Distance:   Bilateral Distance:    Right Eye Near:   Left Eye Near:    Bilateral Near:     Physical Exam Vitals reviewed.  Constitutional:       General: He is awake.     Appearance: Normal appearance. He is well-developed. He is not ill-appearing.     Comments: Very pleasant male presented age in no acute distress sitting comfortably in exam room  HENT:     Head: Normocephalic and atraumatic.     Right Ear: Ear canal and external ear normal. A middle ear effusion is present. Tympanic membrane is not erythematous or bulging.     Left Ear: Ear canal and external ear normal. A middle ear effusion is present. Tympanic membrane is not erythematous or bulging.     Nose:     Right Sinus: Maxillary sinus tenderness and frontal sinus tenderness present.     Left Sinus: Maxillary sinus tenderness and frontal sinus tenderness present.     Mouth/Throat:     Pharynx: Uvula midline. No oropharyngeal exudate or posterior oropharyngeal erythema.  Cardiovascular:     Rate and Rhythm: Normal rate and regular rhythm.     Heart sounds: Normal heart sounds, S1 normal and S2 normal. No murmur heard. Pulmonary:     Effort: Pulmonary effort is normal. No accessory muscle usage or respiratory distress.     Breath sounds: Normal breath sounds. No stridor. No wheezing, rhonchi or rales.     Comments: Clear to auscultation bilaterally Neurological:     Mental Status: He is alert.  Psychiatric:        Behavior: Behavior is cooperative.      UC Treatments / Results  Labs (all labs ordered are listed, but only abnormal results are displayed) Labs Reviewed - No data to display  EKG   Radiology No results found.  Procedures Procedures (including critical care time)  Medications Ordered in UC Medications - No data to display  Initial Impression / Assessment and Plan / UC Course  I have reviewed the triage vital signs and the nursing notes.  Pertinent labs & imaging results that were available during my care of the patient were reviewed by me and considered in my medical decision making (see chart for details).     Patient is  well-appearing, afebrile, nontoxic, nontachycardic.  No indication for viral testing as he has been symptomatic for over a week that this would not change management.  No evidence of otitis media on exam but do suspect that patient has a sinus infection that is causing eustachian tube dysfunction and resulting otalgia.  He was started on Augmentin twice daily for a week but recommended that he use over-the-counter medications including Mucinex for additional symptom relief.  He was started on Xyzal and Flonase  for additional symptom relief.  He is to rest and drink plenty of fluid.  Discussed that if his symptoms are not improving quickly he is to return for reevaluation.  If he has any worsening symptoms he needs to be seen immediately.  Strict return precautions given.  Patient declined work excuse note.  Final Clinical Impressions(s) / UC Diagnoses   Final diagnoses:  Acute non-recurrent pansinusitis     Discharge Instructions      I am concerned that you have a sinus infection.  Please start Augmentin twice daily for 7 days.  Use Mucinex over-the-counter.  You can also use Tylenol.  Start levocetirizine at night and use Flonase 1 spray each nostril daily.  Make sure you are resting and drinking plenty fluid.  You can also use sinus rinses.  If your symptoms are not improving or if anything worsens you should be reevaluated.     ED Prescriptions     Medication Sig Dispense Auth. Provider   fluticasone (FLONASE) 50 MCG/ACT nasal spray Place 1 spray into both nostrils daily. 16 g Mackenzi Krogh K, PA-C   levocetirizine (XYZAL ALLERGY 24HR) 5 MG tablet Take 1 tablet (5 mg total) by mouth every evening. 30 tablet Jewell Haught K, PA-C   amoxicillin-clavulanate (AUGMENTIN) 875-125 MG tablet Take 1 tablet by mouth every 12 (twelve) hours. 14 tablet Tyronn Golda, Noberto Retort, PA-C      PDMP not reviewed this encounter.   Jeani Hawking, PA-C 07/11/22 1907

## 2022-07-11 NOTE — Discharge Instructions (Signed)
I am concerned that you have a sinus infection.  Please start Augmentin twice daily for 7 days.  Use Mucinex over-the-counter.  You can also use Tylenol.  Start levocetirizine at night and use Flonase 1 spray each nostril daily.  Make sure you are resting and drinking plenty fluid.  You can also use sinus rinses.  If your symptoms are not improving or if anything worsens you should be reevaluated.

## 2022-07-11 NOTE — ED Triage Notes (Signed)
Right ear hurts, left ear hurts minimally and gets a headache when he bends over.  Patient reports coughing  symptoms started one week ago.  Patient has tried tylenol

## 2022-08-20 ENCOUNTER — Encounter: Payer: Self-pay | Admitting: Physician Assistant

## 2022-08-20 ENCOUNTER — Ambulatory Visit
Admission: EM | Admit: 2022-08-20 | Discharge: 2022-08-20 | Disposition: A | Discharge status: Home / Self Care | Payer: Commercial Managed Care - HMO | Attending: Physician Assistant | Admitting: Physician Assistant

## 2022-08-20 DIAGNOSIS — J029 Acute pharyngitis, unspecified: Secondary | ICD-10-CM | POA: Insufficient documentation

## 2022-08-20 DIAGNOSIS — Z1152 Encounter for screening for COVID-19: Secondary | ICD-10-CM | POA: Diagnosis present

## 2022-08-20 LAB — POCT RAPID STREP A (OFFICE): Rapid Strep A Screen: NEGATIVE

## 2022-08-20 NOTE — ED Provider Notes (Signed)
[facility]    CSN: [id number] Arrival date & time: 08/20/22  1206      History   Chief Complaint Chief Complaint  Patient presents with   Lymphadenopathy   Sore Throat    HPI Billy Conway is a 21 y.o. male.   Here today for evaluation of sore throat, congestion that she has had for the last 3 days.  He reports that he does have a swollen lymph node that recently popped up to his right occipital area, he just noticed this but did have some neck pain for about a month that comes and goes.  He associated this with injury.  He has taken Tylenol Motrin which does seem to help with symptoms.  He denies any known fever.  He has not had significant cough.  The history is provided by the patient.  Sore Throat Pertinent negatives include no shortness of breath.    Past Medical History:  Diagnosis Date   GERD (gastroesophageal reflux disease)     There are no problems to display for this patient.   Past Surgical History:  Procedure Laterality Date   FINGER SURGERY     TONSILLECTOMY AND ADENOIDECTOMY Bilateral 04/23/2019   Procedure: TONSILLECTOMY AND ADENOIDECTOMY;  Surgeon: Leta Baptist, MD;  Location: [place];  Service: ENT;  Laterality: Bilateral;   Wisdom Teeth Extractions         Home Medications    Prior to Admission medications   Medication Sig Start Date End Date Taking? Authorizing Provider  amoxicillin-clavulanate (AUGMENTIN) 875-125 MG tablet Take 1 tablet by mouth every 12 (twelve) hours. 07/11/22   Raspet, Erin K, PA-C  fluticasone (FLONASE) 50 MCG/ACT nasal spray Place 1 spray into both nostrils daily. 07/11/22   Raspet, Derry Skill, PA-C  levocetirizine (XYZAL ALLERGY 24HR) 5 MG tablet Take 1 tablet (5 mg total) by mouth every evening. 07/11/22   Raspet, Junie Panning K, PA-C  cetirizine (ZYRTEC ALLERGY) 10 MG tablet Take 1 tablet (10 mg total) by mouth daily. 07/12/20 04/12/21  Hall-Potvin, [place], PA-C    Family History History  reviewed. No pertinent family history.  Social History Social History   Tobacco Use   Smoking status: Never   Smokeless tobacco: Never  Vaping Use   Vaping Use: Never used  Substance Use Topics   Alcohol use: Not Currently   Drug use: Not Currently    Types: Marijuana    Comment: marijuana - last smoked 6 months ago     Allergies   Patient has no known allergies.   Review of Systems Review of Systems  Constitutional:  Negative for chills and fever.  HENT:  Positive for congestion and sore throat. Negative for ear pain.   Eyes:  Negative for discharge and redness.  Respiratory:  Positive for cough. Negative for shortness of breath.   Gastrointestinal:  Negative for nausea and vomiting.     Physical Exam Triage Vital Signs ED Triage Vitals  Enc Vitals Group     BP 08/20/22 1233 125/76     Pulse Rate 08/20/22 1233 65     Resp 08/20/22 1233 20     Temp 08/20/22 1233 98 F (36.7 C)     Temp src --      SpO2 08/20/22 1233 96 %     Weight --      Height --      Head Circumference --      Peak Flow --      Pain  Score 08/20/22 1232 0     Pain Loc --      Pain Edu? --      Excl. in GC? --    No data found.  Updated Vital Signs BP 125/76   Pulse 65   Temp 98 F (36.7 C)   Resp 20   SpO2 96%      Physical Exam Vitals and nursing note reviewed.  Constitutional:      General: He is not in acute distress.    Appearance: Normal appearance. He is not ill-appearing.  HENT:     Head: Normocephalic and atraumatic.     Nose: Nose normal. No congestion.     Mouth/Throat:     Mouth: Mucous membranes are moist.     Pharynx: Oropharynx is clear. Posterior oropharyngeal erythema present. No oropharyngeal exudate.  Eyes:     Conjunctiva/sclera: Conjunctivae normal.  Cardiovascular:     Rate and Rhythm: Normal rate and regular rhythm.     Heart sounds: Normal heart sounds. No murmur heard. Pulmonary:     Effort: Pulmonary effort is normal. No respiratory distress.      Breath sounds: Normal breath sounds. No wheezing, rhonchi or rales.  Lymphadenopathy:     Comments: Single pea sized lymph node, semi mobile, to right occipital area  Skin:    General: Skin is warm and dry.  Neurological:     Mental Status: He is alert.  Psychiatric:        Mood and Affect: Mood normal.        Thought Content: Thought content normal.      UC Treatments / Results  Labs (all labs ordered are listed, but only abnormal results are displayed) Labs Reviewed  SARS CORONAVIRUS 2 (TAT 6-24 HRS)  POCT RAPID STREP A (OFFICE)    EKG   Radiology No results found.  Procedures Procedures (including critical care time)  Medications Ordered in UC Medications - No data to display  Initial Impression / Assessment and Plan / UC Course  I have reviewed the triage vital signs and the nursing notes.  Pertinent labs & imaging results that were available during my care of the patient were reviewed by me and considered in my medical decision making (see chart for details).    Rapid strep test negative in office.  Will order COVID screening and encouraged follow-up if no gradual improvement of symptoms or with any further concerns.  Discussed that lymph node is likely reactive due to current illness, but recommended further evaluation if lymph node persists or worsens.  Patient expresses understanding.  Final Clinical Impressions(s) / UC Diagnoses   Final diagnoses:  Encounter for screening for COVID-19  Acute pharyngitis, unspecified etiology   Discharge Instructions   None    ED Prescriptions   None    PDMP not reviewed this encounter.   Tomi Bamberger, PA-C 08/20/22 1958

## 2022-08-20 NOTE — ED Triage Notes (Signed)
Pt presents to uc with co of sore throat, congestion for 3 days, pt also reports swollen lymphnode and neck pain for one month that comes and goes. Pt reports he has attempted tylenol and motrin which helps some

## 2022-08-21 LAB — SARS CORONAVIRUS 2 (TAT 6-24 HRS): SARS Coronavirus 2: NEGATIVE

## 2023-03-17 ENCOUNTER — Telehealth: Payer: Self-pay | Admitting: Obstetrics and Gynecology

## 2023-03-17 NOTE — Telephone Encounter (Signed)
Documentation of lab results related to ongoing pregnancy for Billy Conway. Results were given to Billy per the couple's request.  PC to [name] with results of carrier screening for alpha thalassemia for her partner, Baboucarr Singerman (dob 1999/12/09).  His results are negative, indicating that he is not a carrier for any changes in the gene for alpha hemoglobin.  This baby has a 50% chance to be a carrier like Billy, but is not at risk for health concerns due to alpha thalassemia.  We can be reached at [phone] with any questions.  Cherly Anderson, MS, CGC

## 2023-03-26 ENCOUNTER — Other Ambulatory Visit: Payer: Self-pay

## 2023-04-03 ENCOUNTER — Ambulatory Visit
Admission: RE | Admit: 2023-04-03 | Discharge: 2023-04-03 | Disposition: A | Discharge status: Home / Self Care | Payer: 59 | Source: Ambulatory Visit | Attending: Internal Medicine | Admitting: Internal Medicine

## 2023-04-03 VITALS — BP 132/85 | HR 80 | Temp 97.8°F | Resp 16

## 2023-04-03 DIAGNOSIS — H6593 Unspecified nonsuppurative otitis media, bilateral: Secondary | ICD-10-CM

## 2023-04-03 MED ORDER — CETIRIZINE HCL 10 MG PO TABS: 10.0000 mg | ORAL_TABLET | Freq: Every day | ORAL | 0 refills | Status: AC

## 2023-04-03 MED ORDER — FLUTICASONE PROPIONATE 50 MCG/ACT NA SUSP: 1.0000 | Freq: Every day | NASAL | 0 refills | Status: AC

## 2023-04-03 NOTE — ED Triage Notes (Signed)
Pt states bilateral ear pain for the past 2 days.

## 2023-04-03 NOTE — ED Provider Notes (Signed)
[facility]    CSN: [id number] Arrival date & time: 04/03/23  1813      History   Chief Complaint Chief Complaint  Patient presents with   Otalgia    HPI Billy Conway is a 22 y.o. male.   Patient presents with bilateral ear pain and nasal congestion that started 2 days ago.  Denies any fever or cough.  Patient reports daughter has had similar symptoms.  He has used an over-the-counter eardrops with some improvement in ear pain.   Otalgia   Past Medical History:  Diagnosis Date   GERD (gastroesophageal reflux disease)     There are no problems to display for this patient.   Past Surgical History:  Procedure Laterality Date   FINGER SURGERY     TONSILLECTOMY AND ADENOIDECTOMY Bilateral 04/23/2019   Procedure: TONSILLECTOMY AND ADENOIDECTOMY;  Surgeon: Newman Pies, MD;  Location: [facility];  Service: ENT;  Laterality: Bilateral;   Wisdom Teeth Extractions         Home Medications    Prior to Admission medications   Medication Sig Start Date End Date Taking? Authorizing Provider  cetirizine (ZYRTEC) 10 MG tablet Take 1 tablet (10 mg total) by mouth daily. 04/03/23  Yes Emiyah Spraggins, Rolly Salter E, FNP  fluticasone (FLONASE) 50 MCG/ACT nasal spray Place 1 spray into both nostrils daily. 04/03/23  Yes Katti Pelle, Rolly Salter E, FNP  amoxicillin-clavulanate (AUGMENTIN) 875-125 MG tablet Take 1 tablet by mouth every 12 (twelve) hours. 07/11/22   Raspet, Noberto Retort, PA-C    Family History History reviewed. No pertinent family history.  Social History Social History   Tobacco Use   Smoking status: Never   Smokeless tobacco: Never  Vaping Use   Vaping Use: Never used  Substance Use Topics   Alcohol use: Not Currently   Drug use: Not Currently    Types: Marijuana    Comment: marijuana - last smoked 6 months ago     Allergies   Patient has no known allergies.   Review of Systems Review of Systems Per HPI  Physical Exam Triage Vital  Signs ED Triage Vitals [04/03/23 1821]  Enc Vitals Group     BP 132/85     Pulse Rate 80     Resp 16     Temp 97.8 F (36.6 C)     Temp Source Oral     SpO2 97 %     Weight      Height      Head Circumference      Peak Flow      Pain Score 3     Pain Loc      Pain Edu?      Excl. in GC?    No data found.  Updated Vital Signs BP 132/85 (BP Location: Left Arm)   Pulse 80   Temp 97.8 F (36.6 C) (Oral)   Resp 16   SpO2 97%   Visual Acuity Right Eye Distance:   Left Eye Distance:   Bilateral Distance:    Right Eye Near:   Left Eye Near:    Bilateral Near:     Physical Exam Constitutional:      General: He is not in acute distress.    Appearance: Normal appearance. He is not toxic-appearing or diaphoretic.  HENT:     Head: Normocephalic and atraumatic.     Right Ear: Ear canal normal. No drainage, swelling or tenderness. A middle ear effusion is present. Tympanic membrane is  not perforated, erythematous or bulging.     Left Ear: Ear canal normal. No drainage, swelling or tenderness. A middle ear effusion is present. Tympanic membrane is not perforated, erythematous or bulging.     Nose: Congestion present.     Mouth/Throat:     Mouth: Mucous membranes are moist.     Pharynx: No posterior oropharyngeal erythema.  Pulmonary:     Effort: Pulmonary effort is normal.  Neurological:     General: No focal deficit present.     Mental Status: He is alert and oriented to person, place, and time. Mental status is at baseline.  Psychiatric:        Mood and Affect: Mood normal.        Behavior: Behavior normal.      UC Treatments / Results  Labs (all labs ordered are listed, but only abnormal results are displayed) Labs Reviewed - No data to display  EKG   Radiology No results found.  Procedures Procedures (including critical care time)  Medications Ordered in UC Medications - No data to display  Initial Impression / Assessment and Plan / UC Course  I  have reviewed the triage vital signs and the nursing notes.  Pertinent labs & imaging results that were available during my care of the patient were reviewed by me and considered in my medical decision making (see chart for details).     Suspect allergic rhinitis versus viral symptoms causing nasal congestion and fluid behind TMs.  Patient denies that he takes any daily medications so prescribed cetirizine antihistamine and Flonase to help alleviate symptoms.  Advised follow-up if any symptoms persist or worsen.  Patient verbalized understanding and was agreeable with plan. Final Clinical Impressions(s) / UC Diagnoses   Final diagnoses:  Fluid level behind tympanic membrane of both ears     Discharge Instructions      I have prescribed you two medication to help alleviate fluid in your ears and nasal congestion.  Follow-up if any symptoms persist or worsen.    ED Prescriptions     Medication Sig Dispense Auth. Provider   cetirizine (ZYRTEC) 10 MG tablet Take 1 tablet (10 mg total) by mouth daily. 30 tablet [place], [place], [place]   fluticasone [facility]) 50 MCG/ACT nasal spray Place 1 spray into both nostrils daily. 16 g Gustavus Bryant, [place]      PDMP not reviewed this encounter.   Gustavus Bryant, [place] 04/03/23 [phone]

## 2023-04-03 NOTE — Discharge Instructions (Signed)
I have prescribed you two medication to help alleviate fluid in your ears and nasal congestion.  Follow-up if any symptoms persist or worsen.

## 2023-10-22 ENCOUNTER — Encounter: Payer: Self-pay | Admitting: Physician Assistant

## 2023-10-22 ENCOUNTER — Ambulatory Visit (INDEPENDENT_AMBULATORY_CARE_PROVIDER_SITE_OTHER): Payer: 59 | Admitting: Physician Assistant

## 2023-10-22 ENCOUNTER — Other Ambulatory Visit (INDEPENDENT_AMBULATORY_CARE_PROVIDER_SITE_OTHER): Payer: 59

## 2023-10-22 DIAGNOSIS — M25511 Pain in right shoulder: Secondary | ICD-10-CM

## 2023-10-22 DIAGNOSIS — G8929 Other chronic pain: Secondary | ICD-10-CM

## 2023-10-22 NOTE — Progress Notes (Signed)
Office Visit Note   Patient: Billy Conway           Date of Birth: Dec 11, 2000           MRN: [id number] Visit Date: 10/22/2023              Requested by: No referring provider defined for this encounter. PCP: Patient, No Pcp Per   Assessment & Plan: Visit Diagnoses:  1. Chronic right shoulder pain     Plan: 22 year old right-hand-dominant gentleman presents with a 2-year history of right shoulder pain and popping.  He likes to workout at the gym at 1 time he thought this got better and then it got worse.  He has not taken any medication or physical therapy.  He has tried ice with no relief.  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  He denies any history of dislocation certainly could be a labral pathology or just subtle instability.  I recommended physical therapy if he does not get better than this over the next month we could consider a MRI arthrogram and referral to Dr. August Saucer  Follow-Up Instructions: 1 month  Orders:  Orders Placed This Encounter  Procedures   XR Shoulder Right   No orders of the defined types were placed in this encounter.     Procedures: No procedures performed   Clinical Data: No additional findings.   Subjective: Chief Complaint  Patient presents with   Right Shoulder - Pain    HPI 22 year old presents with a chief complaint of right shoulder pain and popping no particular injury he is right-hand dominant enjoys working out the gym the pain feels anterior and deep.  Denies any pain over the [facility] joint.  Denies any history of dislocation Review of Systems  All other systems reviewed and are negative.    Objective: Vital Signs: There were no vitals taken for this visit.  Physical Exam Constitutional:      Appearance: Normal appearance.  Pulmonary:     Effort: Pulmonary effort is normal.  Skin:    General: Skin is warm and dry.  Neurological:     Mental Status: He is alert.  Psychiatric:         Mood and Affect: Mood normal.        Behavior: Behavior normal.     Ortho Exam Right shoulder he is neurovascular intact good grip strength good strength with resisted abduction external rotation.  Negative apprehension sign.  Negative speeds test Specialty Comments:  No specialty comments available.  Imaging: No results found.   PMFS History: There are no problems to display for this patient.  Past Medical History:  Diagnosis Date   GERD (gastroesophageal reflux disease)     History reviewed. No pertinent family history.  Past Surgical History:  Procedure Laterality Date   FINGER SURGERY     TONSILLECTOMY AND ADENOIDECTOMY Bilateral 04/23/2019   Procedure: TONSILLECTOMY AND ADENOIDECTOMY;  Surgeon: Newman Pies, MD;  [facility];  Service: ENT;  Laterality: Bilateral;   Wisdom Teeth Extractions     Social History   Occupational History   Not on file  Tobacco Use   Smoking status: Never   Smokeless tobacco: Never  Vaping Use   Vaping status: Never Used  Substance and Sexual Activity   Alcohol use: Not Currently   Drug use: Not Currently    Types: Marijuana    Comment: marijuana - last smoked 6 months ago  Sexual activity: Not on file

## 2023-10-28 ENCOUNTER — Ambulatory Visit: Payer: 59 | Admitting: Physician Assistant

## 2023-11-10 ENCOUNTER — Encounter: Payer: Self-pay | Admitting: Physical Therapy

## 2023-11-10 ENCOUNTER — Ambulatory Visit: Payer: 59 | Admitting: Physical Therapy

## 2023-11-10 DIAGNOSIS — M25511 Pain in right shoulder: Secondary | ICD-10-CM | POA: Diagnosis not present

## 2023-11-10 DIAGNOSIS — M6281 Muscle weakness (generalized): Secondary | ICD-10-CM | POA: Diagnosis not present

## 2023-11-10 DIAGNOSIS — G8929 Other chronic pain: Secondary | ICD-10-CM

## 2023-11-10 DIAGNOSIS — R293 Abnormal posture: Secondary | ICD-10-CM

## 2023-11-10 NOTE — Therapy (Signed)
OUTPATIENT PHYSICAL THERAPY EVALUATION   Patient Name: Billy Conway MRN: [id number] DOB:Feb 07, 2001, 22 y.o., male Today's Date: 11/10/2023  END OF SESSION:  PT End of Session - 11/10/23 0919     Visit Number 1    Number of Visits 6    Date for PT Re-Evaluation 12/22/23    Authorization Type [employer] $20 copay    PT Start Time 0855    PT Stop Time 0920    PT Time Calculation (min) 25 min    Activity Tolerance Patient tolerated treatment well    Behavior During Therapy WFL for tasks assessed/performed             Past Medical History:  Diagnosis Date   GERD (gastroesophageal reflux disease)    Past Surgical History:  Procedure Laterality Date   FINGER SURGERY     TONSILLECTOMY AND ADENOIDECTOMY Bilateral 04/23/2019   Procedure: TONSILLECTOMY AND ADENOIDECTOMY;  Surgeon: Newman Pies, MD;  Location: [facility];  Service: ENT;  Laterality: Bilateral;   Wisdom Teeth Extractions     There are no active problems to display for this patient.   PCP: Patient, No Pcp Per  REFERRING PROVIDER: Persons, [place], [place]  REFERRING DIAG: [phone] (ICD-10-CM) - Chronic right shoulder pain  Rationale for Evaluation and Treatment: Rehabilitation  THERAPY DIAG:  Chronic right shoulder pain - Plan: PT plan of care cert/re-cert  Abnormal posture - Plan: PT plan of care cert/re-cert  Muscle weakness (generalized) - Plan: PT plan of care cert/re-cert  ONSET DATE: Date of Referral: 10/22/23; chronic x 2 years   SUBJECTIVE:                                                                                                                                                                                           SUBJECTIVE STATEMENT: Pt reports Rt shoulder pain when lifting at the gym.  He reports it feels unstable and "about to collapse."  Pain is worse with bench press and cable work and any cross body activity.  He denies any specific injury.  PERTINENT  HISTORY:  none  PAIN:  Are you having pain? Yes: NPRS scale: 0 currently, up to 7 Pain location: Rt shoulder; anterior and deep in the shoulder Pain description: popping, "rolling over" Aggravating factors: bench pres, shoulder press/incline press, and cross body adduction, lateral raise Relieving factors: avoiding provoking factors  PRECAUTIONS:  None  RED FLAGS: None   WEIGHT BEARING RESTRICTIONS:  No  FALLS:  Has patient fallen in last 6 months? No  LIVING ENVIRONMENT: Lives with: lives with their family (mother, children (4 y/o and 5 month  old), girlfriend) Lives in: House/apartment   OCCUPATION:  Full time - Paediatric nurse  PLOF:  Independent and Leisure: exercise, golfing  PATIENT GOALS:  Improve pain, be able to lift again   OBJECTIVE:   DIAGNOSTIC FINDINGS:  X-rays: negative  PATIENT SURVEYS:  11/10/23 FOTO 67 (predicted 78)  COGNITIVE STATUS: Within functional limits for tasks assessed   SENSATION: WFL  POSTURE:  rounded shoulders, forward head, and increased thoracic kyphosis  HAND DOMINANCE:  Right  GAIT: 11/10/23 Comments: independent   PALPATION: 11/10/23 no significant trigger points noted; pain deeper in shoulder joint  UPPER EXTREMITY ROM: 11/10/23: WNL c/o tightness on Rt    UPPER EXTREMITY MMT:  MMT Right eval Left eval  Shoulder flexion 4/5 5/5  Shoulder extension    Shoulder abduction 4/5 5/5  Shoulder adduction    Shoulder internal rotation 5/5 5/5  Shoulder external rotation 4/5 5/5   (Blank rows = not tested)    SPECIAL TESTS:  11/10/23 Upper Extremity Impingement tests: Hawkins/Kennedy impingement test: positive  and SLAP lesions: Clunk test: negative   TREATMENT:                                                                                                                              DATE:  11/10/23 See HEP - demonstrated with trial reps performed; min cues needed for technique     PATIENT EDUCATION:   Education details: HEP Person educated: Patient Education method: Programmer, multimedia, Demonstration, and Handouts Education comprehension: verbalized understanding, returned demonstration, and needs further education  HOME EXERCISE PROGRAM: Access Code: [email or web address] URL: [email or web address] Date: 11/10/2023 Prepared by: Moshe Cipro  Exercises - Scaption with Dumbbells  - 1 x daily - 7 x weekly - 1 sets - 8-10 reps - Standing Bent Over Single Arm Shoulder Row with Dumbbells  - 1 x daily - 7 x weekly - 3 sets - 10 reps - Shoulder External Rotation with Anchored Resistance  - 1 x daily - 7 x weekly - 3 sets - 10 reps - Doorway Pec Stretch at 90 Degrees Abduction  - 1 x daily - 7 x weekly - 1 sets - 3 reps - 30 sec hold   ASSESSMENT:  CLINICAL IMPRESSION: Patient is a 22 y.o. male who was seen today for physical therapy evaluation and treatment for Rt shoulder pain. He demonstrates mild strength deficits and postural abnormalities affecting functional mobility.  He will benefit from PT to address deficits listed.     OBJECTIVE IMPAIRMENTS: decreased strength, hypomobility, increased fascial restrictions, impaired UE functional use, postural dysfunction, and pain.   ACTIVITY LIMITATIONS: carrying, lifting, reach over head, and hygiene/grooming  PARTICIPATION LIMITATIONS: meal prep, cleaning, laundry, shopping, community activity, and occupation  PERSONAL FACTORS: Time since onset of injury/illness/exacerbation are also affecting patient's functional outcome.   REHAB POTENTIAL: Good  CLINICAL DECISION MAKING: Stable/uncomplicated  EVALUATION COMPLEXITY: Low   GOALS: Goals reviewed with patient? Yes  SHORT TERM GOALS: Target date: 12/01/2023  Independent with initial HEP Goal status: INITIAL  LONG TERM GOALS: Target date: 12/22/2023  Independent with final HEP Goal status: INITIAL  2.  FOTO score improved to 78 Goal status: INITIAL  3.  Rt shoulder  strength improved to 5/5 for improved function Goal status: INIITAL  4.  Report pain < 3/10 with lifting activities for improved function Goal status: INITIAL   PLAN:  PT FREQUENCY: 1x/week  PT DURATION: 6 weeks  PLANNED INTERVENTIONS: 97164- PT Re-evaluation, 97110-Therapeutic exercises, 97530- Therapeutic activity, 97112- Neuromuscular re-education, 97535- Self Care, 32202- Manual therapy, [id number]- Aquatic Therapy, 97014- Electrical stimulation (unattended), [id number]- Ultrasound, 54270- Ionotophoresis 4mg /ml Dexamethasone, Patient/Family education, Taping, Dry Needling, Joint mobilization, Joint manipulation, Cryotherapy, and Moist heat.  PLAN FOR NEXT SESSION: review HEP, scapular stabilization exercises   NEXT MD VISIT: 11/24/22   Clarita Crane, PT, DPT 11/10/23 9:24 AM

## 2023-11-18 ENCOUNTER — Encounter: Payer: 59 | Admitting: Physical Therapy

## 2023-11-18 ENCOUNTER — Telehealth: Payer: Self-pay | Admitting: Physical Therapy

## 2023-11-18 NOTE — Telephone Encounter (Signed)
 Called pt and LVM as he did not show for his PT appt.  Reminded of next scheduled appt and to call if he needed to cancel.   Clarita Crane, PT, DPT 11/18/23 8:30 AM

## 2023-11-19 ENCOUNTER — Encounter: Payer: Self-pay | Admitting: Rehabilitative and Restorative Service Providers"

## 2023-11-25 ENCOUNTER — Ambulatory Visit: Payer: BC Managed Care – PPO | Admitting: Physician Assistant

## 2023-11-25 ENCOUNTER — Ambulatory Visit (INDEPENDENT_AMBULATORY_CARE_PROVIDER_SITE_OTHER): Payer: BC Managed Care – PPO | Admitting: Rehabilitative and Restorative Service Providers"

## 2023-11-25 ENCOUNTER — Encounter: Payer: Self-pay | Admitting: Physician Assistant

## 2023-11-25 ENCOUNTER — Encounter: Payer: Self-pay | Admitting: Rehabilitative and Restorative Service Providers"

## 2023-11-25 DIAGNOSIS — G8929 Other chronic pain: Secondary | ICD-10-CM

## 2023-11-25 DIAGNOSIS — R293 Abnormal posture: Secondary | ICD-10-CM | POA: Diagnosis not present

## 2023-11-25 DIAGNOSIS — M25511 Pain in right shoulder: Secondary | ICD-10-CM

## 2023-11-25 DIAGNOSIS — M6281 Muscle weakness (generalized): Secondary | ICD-10-CM | POA: Diagnosis not present

## 2023-11-25 NOTE — Therapy (Signed)
 OUTPATIENT PHYSICAL THERAPY TREATMENT   Patient Name: Billy Conway MRN: [id number] DOB:Mar 20, 2001, 23 y.o., male Today's Date: 11/25/2023  END OF SESSION:  PT End of Session - 11/25/23 0815     Visit Number 2    Number of Visits 6    Date for PT Re-Evaluation 12/22/23    Authorization Type [employer] $20 copay    Progress Note Due on Visit 6    PT Start Time 0806    PT Stop Time 0844    PT Time Calculation (min) 38 min    Activity Tolerance Patient tolerated treatment well    Behavior During Therapy WFL for tasks assessed/performed              Past Medical History:  Diagnosis Date   GERD (gastroesophageal reflux disease)    Past Surgical History:  Procedure Laterality Date   FINGER SURGERY     TONSILLECTOMY AND ADENOIDECTOMY Bilateral 04/23/2019   Procedure: TONSILLECTOMY AND ADENOIDECTOMY;  Surgeon: Karis Clunes, MD;  Location: [facility];  Service: ENT;  Laterality: Bilateral;   Wisdom Teeth Extractions     There are no active problems to display for this patient.   PCP: Patient, No Pcp Per  REFERRING PROVIDER: Persons, Ronal Dragon, [place]  REFERRING DIAG: [phone] (ICD-10-CM) - Chronic right shoulder pain  Rationale for Evaluation and Treatment: Rehabilitation  THERAPY DIAG:  Chronic right shoulder pain  Abnormal posture  Muscle weakness (generalized)  ONSET DATE: Date of Referral: 10/22/23; chronic x 2 years   SUBJECTIVE:                                                                                                                                                                                           SUBJECTIVE STATEMENT: Pt indicated having some mild improvements.  Pt indicated incline press attempts did cause pain.   PERTINENT HISTORY:  none  PAIN:  NPRS scale: 0/10 upon arrival  Pain location: Rt shoulder; anterior and deep in the shoulder Pain description: popping, rolling over Aggravating factors: bench pres,  shoulder press/incline press, and cross body adduction, lateral raise Relieving factors: avoiding provoking factors  PRECAUTIONS:  None  RED FLAGS: None   WEIGHT BEARING RESTRICTIONS:  No  FALLS:  Has patient fallen in last 6 months? No  LIVING ENVIRONMENT: Lives with: lives with their family (mother, children (50 y/o and 70 month old), girlfriend) Lives in: House/apartment   OCCUPATION:  Full time - paediatric nurse  PLOF:  Independent and Leisure: exercise, golfing  PATIENT GOALS:  Improve pain, be able to lift again   OBJECTIVE:   DIAGNOSTIC FINDINGS:  X-rays: negative  PATIENT SURVEYS:  11/10/23 FOTO 67 (predicted 78)  COGNITIVE STATUS: 11/10/2023 Within functional limits for tasks assessed   SENSATION: 11/10/2023 [facility]  POSTURE:  11/10/2023 rounded shoulders, forward head, and increased thoracic kyphosis  HAND DOMINANCE:  11/10/2023 Right  GAIT: 11/10/23 Comments: independent   PALPATION: 11/10/23 no significant trigger points noted; pain deeper in shoulder joint  UPPER EXTREMITY ROM: 11/10/23: WNL c/o tightness on Rt    UPPER EXTREMITY MMT:  MMT Right eval Left eval Right 11/25/2023  Shoulder flexion 4/5 5/5 5/5  Shoulder extension     Shoulder abduction 4/5 5/5 5/5  Shoulder adduction     Shoulder internal rotation 5/5 5/5 5/5  Shoulder external rotation 4/5 5/5 4+/5   (Blank rows = not tested)    SPECIAL TESTS:  11/10/23 Upper Extremity Impingement tests: Hawkins/Kennedy impingement test: positive  and SLAP lesions: Clunk test: negative                      TREATMENT:                                                                     DATE: 11/18/2023 Therex: Prone bilateral scap retraction with GH ext hold 5 sec x 10 Prone bilateral scap retraction with horizontal abduction hold 5 sec x 10 Prone bilateral scaption Y lift 5 sec hold x 10  Standing bilateral blue band rows 2 x 15 Standing bilateral blue band GH ext 2 x 15 Standing Rt  shoulder blue band ER c slow eccentric return focus with towel under arm x 15 Standing Rt shoulder blue band ER c flexion punch to 90 deg flexion and return 2 x 10  Standing blue band anterior resistance for bilateral snow angel abduction full range 2 x 15 UBE UE only fwd/back 4 mins each way with 15 second interval faster each top of minute.   Review of HEP adjustments.  Handout provided.    TREATMENT:                                                                     DATE:11/10/23 See HEP - demonstrated with trial reps performed; min cues needed for technique   PATIENT EDUCATION:  11/25/2023 Education details: HEP update Person educated: Patient Education method: Explanation, Demonstration, and Handouts Education comprehension: verbalized understanding, visual cues, returned demonstration  HOME EXERCISE PROGRAM: Access Code: [email or web address] URL: [email or web address] Date: 11/25/2023 Prepared by: Ozell Silvan  Exercises - Scaption with Dumbbells  - 1 x daily - 7 x weekly - 1 sets - 8-10 reps - Standing Bent Over Single Arm Shoulder Row with Dumbbells  - 1 x daily - 7 x weekly - 3 sets - 10 reps - Shoulder External Rotation with Anchored Resistance  - 1 x daily - 7 x weekly - 3 sets - 10 reps - Doorway Pec Stretch at 90 Degrees Abduction  - 1 x daily - 7 x weekly - 1 sets - 3 reps - 30 sec hold - Prone Scapular  Slide with Shoulder Extension  - 1 x daily - 7 x weekly - 1 sets - 10 reps - 5 hold - Prone Scapular Retraction Arms at Side  - 1 x daily - 7 x weekly - 1 sets - 10 reps - 5 hold - Prone Scapular Retraction Y  - 1 x daily - 7 x weekly - 1 sets - 10 reps - 5 hold   ASSESSMENT:  CLINICAL IMPRESSION: Strength testing for Rt shoulder showed improvement compared to eval without pain recreated during testing.  Continued emphasis on strengthening periscapular and rotator cuff musculature to improve biomechanics of overhead reaching/lift.  Added several exercises for at  home use going forward to continue progression in HEP.   OBJECTIVE IMPAIRMENTS: decreased strength, hypomobility, increased fascial restrictions, impaired UE functional use, postural dysfunction, and pain.   ACTIVITY LIMITATIONS: carrying, lifting, reach over head, and hygiene/grooming  PARTICIPATION LIMITATIONS: meal prep, cleaning, laundry, shopping, community activity, and occupation  PERSONAL FACTORS: Time since onset of injury/illness/exacerbation are also affecting patient's functional outcome.   REHAB POTENTIAL: Good  CLINICAL DECISION MAKING: Stable/uncomplicated  EVALUATION COMPLEXITY: Low   GOALS: Goals reviewed with patient? Yes  SHORT TERM GOALS: Target date: 12/01/2023  Independent with initial HEP Goal status: on going 11/25/2023  LONG TERM GOALS: Target date: 12/22/2023  Independent with final HEP Goal status: INITIAL  2.  FOTO score improved to 78 Goal status: INITIAL  3.  Rt shoulder strength improved to 5/5 for improved function Goal status: INIITAL  4.  Report pain < 3/10 with lifting activities for improved function Goal status: INITIAL   PLAN:  PT FREQUENCY: 1x/week  PT DURATION: 6 weeks  PLANNED INTERVENTIONS: 97164- PT Re-evaluation, 97110-Therapeutic exercises, 97530- Therapeutic activity, 97112- Neuromuscular re-education, 97535- Self Care, 02859- Manual therapy, [id number]- Aquatic Therapy, 97014- Electrical stimulation (unattended), [id number]- Ultrasound, 02966- Ionotophoresis 4mg /ml Dexamethasone , Patient/Family education, Taping, Dry Needling, Joint mobilization, Joint manipulation, Cryotherapy, and Moist heat.  PLAN FOR NEXT SESSION:  Possible FOTO recheck due to time since eval (around 30 days).  Progressive functional strengthening for overhead reaching movements, gym return activity.    NEXT MD VISIT: 11/24/22  Ozell Silvan, PT, DPT, OCS, ATC 11/25/23  8:40 AM

## 2023-11-25 NOTE — Progress Notes (Signed)
   Office Visit Note   Patient: Billy Conway           Date of Birth: 2001/06/05           MRN: [id number] Visit Date: 11/25/2023              Requested by: No referring provider defined for this encounter. PCP: Patient, No Pcp Per  No chief complaint on file.     HPI: Patient is a pleasant 23 year old who has been following who has an overall 2-year history of right shoulder popping and clicking and painful.  This makes things difficult for his everyday activities.  He has tried physical therapy but still feels it is the same.  Assessment & Plan: Visit Diagnoses:  1. Chronic right shoulder pain     Plan: Will have him get an MRI arthrogram to rule out a labral pathology.  I do think he should finish his final PT session can follow-up with [name] once this is complete  Follow-Up Instructions: No follow-ups on file.   Ortho Exam  Patient is alert, oriented, no adenopathy, well-dressed, normal affect, normal respiratory effort. Full range of motion does reproduce some clicking that is mildly painful neurovascularly intact exam unchanged from previous he is got good strength with resisted abduction external and internal rotation no apprehension  Imaging: No results found. No images are attached to the encounter.  Labs: Lab Results  Component Value Date   REPTSTATUS 06/25/2016 FINAL 06/23/2016   CULT MODERATE STREPTOCOCCUS,BETA HEMOLYTIC NOT GROUP A 06/23/2016     No results found for: ALBUMIN, PREALBUMIN, CBC  No results found for: MG No results found for: VD25OH  No results found for: PREALBUMIN     No data to display           There is no height or weight on file to calculate BMI.  Orders:  Orders Placed This Encounter  Procedures   MR SHOULDER RIGHT W CONTRAST   Arthrogram   No orders of the defined types were placed in this encounter.    Procedures: No procedures performed  Clinical Data: No additional  findings.  ROS:  All other systems negative, except as noted in the HPI. Review of Systems  Objective: Vital Signs: There were no vitals taken for this visit.  Specialty Comments:  No specialty comments available.  PMFS History: There are no active problems to display for this patient.  Past Medical History:  Diagnosis Date   GERD (gastroesophageal reflux disease)     No family history on file.  Past Surgical History:  Procedure Laterality Date   FINGER SURGERY     TONSILLECTOMY AND ADENOIDECTOMY Bilateral 04/23/2019   Procedure: TONSILLECTOMY AND ADENOIDECTOMY;  Surgeon: Karis Clunes, MD;  Location: [facility];  Service: ENT;  Laterality: Bilateral;   Wisdom Teeth Extractions     Social History   Occupational History   Not on file  Tobacco Use   Smoking status: Never   Smokeless tobacco: Never  Vaping Use   Vaping status: Never Used  Substance and Sexual Activity   Alcohol use: Not Currently   Drug use: Not Currently    Types: Marijuana    Comment: marijuana - last smoked 6 months ago   Sexual activity: Not on file

## 2023-12-02 ENCOUNTER — Encounter: Payer: Self-pay | Admitting: Rehabilitative and Restorative Service Providers"

## 2023-12-02 ENCOUNTER — Ambulatory Visit (INDEPENDENT_AMBULATORY_CARE_PROVIDER_SITE_OTHER): Payer: BC Managed Care – PPO | Admitting: Rehabilitative and Restorative Service Providers"

## 2023-12-02 DIAGNOSIS — G8929 Other chronic pain: Secondary | ICD-10-CM | POA: Diagnosis not present

## 2023-12-02 DIAGNOSIS — M25511 Pain in right shoulder: Secondary | ICD-10-CM

## 2023-12-02 DIAGNOSIS — M6281 Muscle weakness (generalized): Secondary | ICD-10-CM

## 2023-12-02 DIAGNOSIS — R293 Abnormal posture: Secondary | ICD-10-CM | POA: Diagnosis not present

## 2023-12-02 NOTE — Therapy (Addendum)
 OUTPATIENT PHYSICAL THERAPY TREATMENT / DISCHARGE   Patient Name: Billy Conway MRN: [id number] DOB:[birth year], 23 y.o., male Today's Date: 12/02/2023  END OF SESSION:  PT End of Session - 12/02/23 0850     Visit Number 3    Number of Visits 6    Date for PT Re-Evaluation 12/22/23    Authorization Type [employer] $20 copay    Progress Note Due on Visit 6    PT Start Time 0844    PT Stop Time 0922    PT Time Calculation (min) 38 min    Activity Tolerance Patient tolerated treatment well    Behavior During Therapy WFL for tasks assessed/performed               Past Medical History:  Diagnosis Date   GERD (gastroesophageal reflux disease)    Past Surgical History:  Procedure Laterality Date   FINGER SURGERY     TONSILLECTOMY AND ADENOIDECTOMY Bilateral 04/23/2019   Procedure: TONSILLECTOMY AND ADENOIDECTOMY;  Surgeon: Newman Pies, MD;  Location: [facility];  Service: ENT;  Laterality: Bilateral;   Wisdom Teeth Extractions     There are no active problems to display for this patient.   PCP: Patient, No Pcp Per  REFERRING PROVIDER: Persons, [place], [place]  REFERRING DIAG: [phone] (ICD-10-CM) - Chronic right shoulder pain  Rationale for Evaluation and Treatment: Rehabilitation  THERAPY DIAG:  Chronic right shoulder pain  Abnormal posture  Muscle weakness (generalized)  ONSET DATE: Date of Referral: 10/22/23; chronic x 2 years   SUBJECTIVE:                                                                                                                                                                                           SUBJECTIVE STATEMENT: Pt indicated pain up to 4-5/10 when incline and bench press.    PERTINENT HISTORY:  none  PAIN:  NPRS scale: 0/10 upon arrival  Pain location: Rt shoulder; anterior and deep in the shoulder Pain description: popping, "rolling over" Aggravating factors: bench pres, shoulder  press/incline press, and cross body adduction, lateral raise Relieving factors: avoiding provoking factors  PRECAUTIONS:  None  RED FLAGS: None   WEIGHT BEARING RESTRICTIONS:  No  FALLS:  Has patient fallen in last 6 months? No  LIVING ENVIRONMENT: Lives with: lives with their family (mother, children (72 y/o and 81 month old), girlfriend) Lives in: House/apartment   OCCUPATION:  Full time - Paediatric nurse  PLOF:  Independent and Leisure: exercise, golfing  PATIENT GOALS:  Improve pain, be able to lift again   OBJECTIVE:   DIAGNOSTIC FINDINGS:  X-rays: negative  PATIENT SURVEYS:  11/10/23 FOTO 67 (predicted 78)  COGNITIVE STATUS: 11/10/2023 Within functional limits for tasks assessed   SENSATION: 11/10/2023 [facility]  POSTURE:  11/10/2023 rounded shoulders, forward head, and increased thoracic kyphosis  HAND DOMINANCE:  11/10/2023 Right  GAIT: 11/10/23 Comments: independent   PALPATION: 11/10/23 no significant trigger points noted; pain deeper in shoulder joint  UPPER EXTREMITY ROM: 11/10/23: WNL c/o tightness on Rt    UPPER EXTREMITY MMT:  MMT Right eval Left eval Right 11/25/2023  Shoulder flexion 4/5 5/5 5/5  Shoulder extension     Shoulder abduction 4/5 5/5 5/5  Shoulder adduction     Shoulder internal rotation 5/5 5/5 5/5  Shoulder external rotation 4/5 5/5 4+/5   (Blank rows = not tested)    SPECIAL TESTS:  11/10/23 Upper Extremity Impingement tests: Hawkins/Kennedy impingement test: positive  and SLAP lesions: Clunk test: negative                      TREATMENT:                                                                     DATE: 12/02/2023 Therex: UBE UE only fwd/back 4 mins each way with 15 second interval faster each top of minute.  Machine chest press higher rep to fatigue focus 85 lbs x 40 Machine SA press single arm 5 sec hold x 15 performed bilateral 45 lbs  Blue band ER c flexion punch and rotation to face band attachement  stabilization 2 x 15, performed bilaterally  Standing blue band anterior resistance for bilateral snow angel abduction full range 2 x 15  Review of procedures for higher rep lower weight hypertrophy training to help build strength in existing exercise routine but reduced chances for pain response.  Question and answer time provided to improve comprehension.    TREATMENT:                                                                     DATE: 11/18/2023 Therex: Prone bilateral scap retraction with GH ext hold 5 sec x 10 Prone bilateral scap retraction with horizontal abduction hold 5 sec x 10 Prone bilateral scaption Y lift 5 sec hold x 10  Standing bilateral blue band rows 2 x 15 Standing bilateral blue band GH ext 2 x 15 Standing Rt shoulder blue band ER c slow eccentric return focus with towel under arm x 15 Standing Rt shoulder blue band ER c flexion punch to 90 deg flexion and return 2 x 10  Standing blue band anterior resistance for bilateral snow angel abduction full range 2 x 15 UBE UE only fwd/back 4 mins each way with 15 second interval faster each top of minute.   Review of HEP adjustments.  Handout provided.    TREATMENT:  DATE:11/10/23 See HEP - demonstrated with trial reps performed; min cues needed for technique   PATIENT EDUCATION:  11/25/2023 Education details: HEP update Person educated: Patient Education method: Explanation, Demonstration, and Handouts Education comprehension: verbalized understanding, visual cues, returned demonstration  HOME EXERCISE PROGRAM: Access Code: [email or web address] URL: [email or web address] Date: 11/25/2023 Prepared by: Chyrel Masson  Exercises - Scaption with Dumbbells  - 1 x daily - 7 x weekly - 1 sets - 8-10 reps - Standing Bent Over Single Arm Shoulder Row with Dumbbells  - 1 x daily - 7 x weekly - 3 sets - 10 reps - Shoulder External Rotation with Anchored  Resistance  - 1 x daily - 7 x weekly - 3 sets - 10 reps - Doorway Pec Stretch at 90 Degrees Abduction  - 1 x daily - 7 x weekly - 1 sets - 3 reps - 30 sec hold - Prone Scapular Slide with Shoulder Extension  - 1 x daily - 7 x weekly - 1 sets - 10 reps - 5 hold - Prone Scapular Retraction Arms at Side  - 1 x daily - 7 x weekly - 1 sets - 10 reps - 5 hold - Prone Scapular Retraction Y  - 1 x daily - 7 x weekly - 1 sets - 10 reps - 5 hold   ASSESSMENT:  CLINICAL IMPRESSION: Pt continued to indicated some complaints with end range bench press (posterior movement) and overhead training.  Discussed and adapted some training program recommendations to continue to promote muscle growth and strengthening with higher reps and lower resistance to reduce pain response chances.   OBJECTIVE IMPAIRMENTS: decreased strength, hypomobility, increased fascial restrictions, impaired UE functional use, postural dysfunction, and pain.   ACTIVITY LIMITATIONS: carrying, lifting, reach over head, and hygiene/grooming  PARTICIPATION LIMITATIONS: meal prep, cleaning, laundry, shopping, community activity, and occupation  PERSONAL FACTORS: Time since onset of injury/illness/exacerbation are also affecting patient's functional outcome.   REHAB POTENTIAL: Good  CLINICAL DECISION MAKING: Stable/uncomplicated  EVALUATION COMPLEXITY: Low   GOALS: Goals reviewed with patient? Yes  SHORT TERM GOALS: Target date: 12/01/2023  Independent with initial HEP Goal status: oMet  LONG TERM GOALS: Target date: 12/22/2023  Independent with final HEP Goal status: INITIAL  2.  FOTO score improved to 78 Goal status: INITIAL  3.  Rt shoulder strength improved to 5/5 for improved function Goal status: INIITAL  4.  Report pain < 3/10 with lifting activities for improved function Goal status: INITIAL   PLAN:  PT FREQUENCY: 1x/week  PT DURATION: 6 weeks  PLANNED INTERVENTIONS: 97164- PT Re-evaluation,  97110-Therapeutic exercises, 97530- Therapeutic activity, 97112- Neuromuscular re-education, 97535- Self Care, 11914- Manual therapy, [id number]- Aquatic Therapy, 97014- Electrical stimulation (unattended), [id number]- Ultrasound, 78295- Ionotophoresis 4mg /ml Dexamethasone, Patient/Family education, Taping, Dry Needling, Joint mobilization, Joint manipulation, Cryotherapy, and Moist heat.  PLAN FOR NEXT SESSION:  FOTO recheck due to time since eval (around 30 days) upon next return. Indications from last PA visit was MRI to rule out labral.    NEXT MD VISIT: 11/24/22  Chyrel Masson, PT, DPT, OCS, ATC 12/02/23  9:23 AM    PHYSICAL THERAPY DISCHARGE SUMMARY  Visits from Start of Care: 3  Current functional level related to goals / functional outcomes: See note   Remaining deficits: See note   Education / Equipment: HEP  Patient goals were partially met. Patient is being discharged due to not returning since the last visit.  Chyrel Masson, PT, DPT, OCS, ATC 02/16/24  1:37 PM

## 2023-12-10 ENCOUNTER — Encounter: Payer: Self-pay | Admitting: Physician Assistant

## 2023-12-17 ENCOUNTER — Other Ambulatory Visit: Payer: BC Managed Care – PPO

## 2023-12-17 ENCOUNTER — Inpatient Hospital Stay: Admission: RE | Admit: 2023-12-17 | Payer: BC Managed Care – PPO | Source: Ambulatory Visit

## 2023-12-19 ENCOUNTER — Telehealth: Payer: Self-pay | Admitting: Orthopedic Surgery

## 2023-12-19 NOTE — Telephone Encounter (Signed)
 Spoke with patient he advised that he is not going to have an MRI

## 2023-12-30 ENCOUNTER — Ambulatory Visit: Payer: BC Managed Care – PPO | Admitting: Physician Assistant

## 2024-09-13 ENCOUNTER — Encounter: Payer: Self-pay | Admitting: Radiology
